# Patient Record
Sex: Female | Born: 1935 | Race: White | Hispanic: No | Smoking: Former smoker
Health system: Southern US, Community
[De-identification: ages and names within clinical notes are randomized; demographics above are authoritative.]

## PROBLEM LIST (undated history)

## (undated) DIAGNOSIS — L409 Psoriasis, unspecified: Secondary | ICD-10-CM

## (undated) DIAGNOSIS — C801 Malignant (primary) neoplasm, unspecified: Secondary | ICD-10-CM

## (undated) DIAGNOSIS — H269 Unspecified cataract: Secondary | ICD-10-CM

## (undated) DIAGNOSIS — G40909 Epilepsy, unspecified, not intractable, without status epilepticus: Secondary | ICD-10-CM

## (undated) DIAGNOSIS — Z598 Other problems related to housing and economic circumstances: Secondary | ICD-10-CM

## (undated) DIAGNOSIS — Z972 Presence of dental prosthetic device (complete) (partial): Secondary | ICD-10-CM

## (undated) DIAGNOSIS — I2510 Atherosclerotic heart disease of native coronary artery without angina pectoris: Secondary | ICD-10-CM

## (undated) DIAGNOSIS — IMO0001 Reserved for inherently not codable concepts without codable children: Secondary | ICD-10-CM

## (undated) DIAGNOSIS — I639 Cerebral infarction, unspecified: Secondary | ICD-10-CM

## (undated) DIAGNOSIS — I272 Pulmonary hypertension, unspecified: Secondary | ICD-10-CM

## (undated) DIAGNOSIS — H3530 Unspecified macular degeneration: Secondary | ICD-10-CM

## (undated) DIAGNOSIS — R569 Unspecified convulsions: Secondary | ICD-10-CM

## (undated) DIAGNOSIS — R413 Other amnesia: Secondary | ICD-10-CM

## (undated) DIAGNOSIS — J302 Other seasonal allergic rhinitis: Secondary | ICD-10-CM

## (undated) DIAGNOSIS — Z87898 Personal history of other specified conditions: Secondary | ICD-10-CM

## (undated) DIAGNOSIS — H548 Legal blindness, as defined in USA: Secondary | ICD-10-CM

## (undated) DIAGNOSIS — Z973 Presence of spectacles and contact lenses: Secondary | ICD-10-CM

## (undated) DIAGNOSIS — R443 Hallucinations, unspecified: Secondary | ICD-10-CM

## (undated) DIAGNOSIS — I10 Essential (primary) hypertension: Secondary | ICD-10-CM

## (undated) DIAGNOSIS — K08109 Complete loss of teeth, unspecified cause, unspecified class: Secondary | ICD-10-CM

## (undated) HISTORY — DX: Legal blindness, as defined in USA: H54.8

## (undated) HISTORY — PX: HIP SURGERY: SHX245

## (undated) HISTORY — PX: APPENDECTOMY: SHX54

## (undated) HISTORY — DX: Essential (primary) hypertension: I10

## (undated) HISTORY — DX: Malignant (primary) neoplasm, unspecified: C80.1

## (undated) HISTORY — DX: Hallucinations, unspecified: R44.3

## (undated) HISTORY — PX: SPINE SURGERY: SHX786

## (undated) HISTORY — DX: Cerebral infarction, unspecified: I63.9

## (undated) HISTORY — DX: Pulmonary hypertension, unspecified: I27.20

## (undated) HISTORY — DX: Epilepsy, unspecified, not intractable, without status epilepticus: G40.909

## (undated) HISTORY — DX: Atherosclerotic heart disease of native coronary artery without angina pectoris: I25.10

## (undated) HISTORY — DX: Unspecified convulsions: R56.9

## (undated) HISTORY — PX: GALLBLADDER SURGERY: SHX652

## (undated) HISTORY — DX: Unspecified cataract: H26.9

## (undated) HISTORY — DX: Psoriasis, unspecified: L40.9

## (undated) HISTORY — DX: Personal history of other specified conditions: Z87.898

## (undated) HISTORY — PX: CHOLECYSTECTOMY: SHX55

## (undated) HISTORY — PX: TONSILLECTOMY: SUR1361

## (undated) HISTORY — DX: Unspecified macular degeneration: H35.30

---

## 2002-05-07 ENCOUNTER — Ambulatory Visit (HOSPITAL_COMMUNITY): Admission: RE | Admit: 2002-05-07 | Discharge: 2002-05-07 | Payer: Self-pay | Admitting: Gastroenterology

## 2002-07-23 ENCOUNTER — Ambulatory Visit (HOSPITAL_COMMUNITY): Admission: RE | Admit: 2002-07-23 | Discharge: 2002-07-23 | Payer: Self-pay | Admitting: Gastroenterology

## 2003-07-24 ENCOUNTER — Ambulatory Visit (HOSPITAL_COMMUNITY): Admission: RE | Admit: 2003-07-24 | Discharge: 2003-07-24 | Payer: Self-pay | Admitting: Neurology

## 2003-07-24 ENCOUNTER — Encounter: Payer: Self-pay | Admitting: Neurology

## 2003-07-28 ENCOUNTER — Ambulatory Visit (HOSPITAL_COMMUNITY): Admission: RE | Admit: 2003-07-28 | Discharge: 2003-07-28 | Payer: Self-pay | Admitting: Neurology

## 2004-10-21 ENCOUNTER — Encounter: Admission: RE | Admit: 2004-10-21 | Discharge: 2004-10-21 | Payer: Self-pay | Admitting: Gastroenterology

## 2004-10-28 ENCOUNTER — Encounter (INDEPENDENT_AMBULATORY_CARE_PROVIDER_SITE_OTHER): Payer: Self-pay | Admitting: *Deleted

## 2004-10-28 ENCOUNTER — Ambulatory Visit (HOSPITAL_COMMUNITY): Admission: RE | Admit: 2004-10-28 | Discharge: 2004-10-28 | Payer: Self-pay | Admitting: Gastroenterology

## 2005-03-16 ENCOUNTER — Ambulatory Visit (HOSPITAL_COMMUNITY): Admission: RE | Admit: 2005-03-16 | Discharge: 2005-03-16 | Payer: Self-pay | Admitting: Gastroenterology

## 2005-11-20 DIAGNOSIS — I2510 Atherosclerotic heart disease of native coronary artery without angina pectoris: Secondary | ICD-10-CM

## 2005-11-20 HISTORY — DX: Atherosclerotic heart disease of native coronary artery without angina pectoris: I25.10

## 2005-11-22 ENCOUNTER — Inpatient Hospital Stay (HOSPITAL_COMMUNITY): Admission: EM | Admit: 2005-11-22 | Discharge: 2005-11-23 | Payer: Self-pay | Admitting: Emergency Medicine

## 2005-11-22 HISTORY — PX: CARDIAC CATHETERIZATION: SHX172

## 2008-10-20 ENCOUNTER — Encounter: Admission: RE | Admit: 2008-10-20 | Discharge: 2008-10-20 | Payer: Self-pay | Admitting: Gastroenterology

## 2009-05-21 ENCOUNTER — Encounter: Admission: RE | Admit: 2009-05-21 | Discharge: 2009-05-21 | Payer: Self-pay | Admitting: Gastroenterology

## 2010-07-19 ENCOUNTER — Encounter: Admission: RE | Admit: 2010-07-19 | Discharge: 2010-07-19 | Payer: Self-pay | Admitting: Internal Medicine

## 2011-04-07 NOTE — Discharge Summary (Signed)
[name], [name]               ACCOUNT NO.:  [id number]   MEDICAL RECORD NO.:  [id number]          PATIENT TYPE:  INP   LOCATION:  3734                         FACILITY:  [facility]   PHYSICIAN:  Darlin Priestly, MD  DATE OF BIRTH:  05/19/1925   DATE OF ADMISSION:  11/22/2005  DATE OF DISCHARGE:  11/23/2005                                 DISCHARGE SUMMARY   PRIMARY CARE PHYSICIAN:  Gabriel Earing, M.D.   DISCHARGE DIAGNOSES:  1.  Chest pain.  2.  Tachypalpitations.  3.  Hyperlipidemia.  4.  Hypertension.  5.  History of esophageal stricture with dilatation.  6.  Illegally blind.  7.  Right carotid bruit.  8.  Nonobstructive coronary disease.  9.  Hyperdynamic LV function.   DISCHARGE CONDITION:  Improved.   PROCEDURES:  On 11/22/05, heart catheterization by Dr. Julieanne Manson.   DISCHARGE MEDICATIONS:  1.  Enteric-coated aspirin 81 mg daily.  2.  Lopressor 50, half tablet b.i.d.  3.  Diltiazem ER 120 daily.  4.  Captopril 50 mg b.i.d.  5.  Lipitor 10 mg, which is a half of a 20 mg tablet, daily.  6.  Nasonex spray as needed as before.  7.  Okay to continue antihistamines.   DISCHARGE INSTRUCTIONS:  1.  Low-fat, low-salt diet.  2.  Wash cath site with soap and water.  Call if any bleeding, swelling, or      drainage.  3.  Follow up with Dr. Jenne Campus 12/07/2005 as previously instructed.   HISTORY OF PRESENT ILLNESS:  A 77 year old white female who was admitted to  [facility] by Dr. Earnest Conroy, on call for Dr. Jenne Campus, secondary  to chest pain.  She presented to the emergency room after experiencing an  episode of chest pain which began at home.  She was sedentary at the time.  It was described as substernal upper epigastric tightness.  It did not  radiate and was not associated with dyspnea, diaphoresis, or nausea and no  exacerbating factors.  She felt as though her heart rate was beating rapidly  at the time.  She presented to the emergency room but the  tightness was  pretty much resolved at that time but symptoms lasted three hours.   The patient previously has no history of cardiac disease.  She has seen Dr.  Jenne Campus in the past for chest tightness and rapid heart beat.  The patient  has cardiac risk factors of hypertension, family history of heart disease,  age, and smoking history but she has stopped.   Other history includes esophageal stricture which was dilated in 02/2005.   OUTPATIENT MEDICATIONS:  Essentially the same as discharge except Lopressor.   ALLERGIES:  Ephedra.   PAST SURGICAL HISTORY:  Splenectomy, cholecystectomy, cervical laminectomy.   FAMILY HISTORY/SOCIAL HISTORY/REVIEW OF SYSTEMS:  See history and physical.   PHYSICAL EXAMINATION AT DISCHARGE:  VITAL SIGNS:  Blood pressure 110/60,  pulse 80, respiratory rate 18, temperature 97.5, oxygen saturation on room  air 95%.  HEART:  Regular rate and rhythm, right groin cath site, moderate bruising,  no bruits.  LABORATORY DATA:  Admitting labs: Hemoglobin 13.5, hematocrit 39, WBC 8.5,  platelets 228.  Neutrophils 77, lymphs 8, monos 12, eosinophils 3, basophils  0.  At discharge, hemoglobin 14.6.  Protime 13.2, INR 1, PTT 30 on heparin  at 1.29.   Admission chemistries: Sodium 139, potassium 3.9, chloride 107, CO2 27,  glucose 120, BUN 18, creatinine 0.8, calcium 8.4, total protein 5.8, AST 25,  ALT 15, alkaline phosphatase 47, total bilirubin 1.1.  Magnesium 2.2.  Potassium at discharge 4.6, BUN 7, creatinine 0.7.   Cardiac enzymes were negative ranging 113, 111, and 82.5.  Troponin-I of  0.01.   UA clear except for trace leukocyte esterase.  Clean catch was done.   Chest x-ray on admission: No segmental region of consolidation or congestive  failure.  Mild increase in lung markings.  It also recommended PA and  lateral chest x-ray at some point and that was to be done as an outpatient  if Dr. Jenne Campus agrees.   HOSPITAL COURSE:  Ms. [name] was admitted  to [facility] by Dr.  Waldon Reining, on call for Dr. Jenne Campus, to start on intravenous heparin,  nitroglycerin, and underwent cardiac catheterization revealing  nonobstructive coronary disease.  RCA had a 20-30% stenosis.  LV was  hyperdynamic.  She had no renal artery stenosis, no abdominal aortic  aneurysms.  Please see the dictated note.   PLAN:  The patient was seen 11/23/2005 by Dr. Clarene Duke who also did a heart  cath and felt she was stable for discharge home and will follow up with Dr.  Jenne Campus.  Here in the hospital, she had no arrhythmias that are documented.  She only has sinus rhythm.      [name]. [name]      Darlin Priestly, MD  Electronically Signed    LRI/MEDQ  D:  11/23/2005  T:  11/23/2005  Job:  [id number]   cc:   Gabriel Earing, M.D.  Fax: [phone]

## 2011-04-07 NOTE — H&P (Signed)
NAMECRYSTINA, BORRAYO               ACCOUNT NO.:  [id number]   MEDICAL RECORD NO.:  [id number]          PATIENT TYPE:  EMS   LOCATION:  MAJO                         FACILITY:  [facility]   PHYSICIAN:  Ulyses Amor, MD DATE OF BIRTH:  [birth year]   DATE OF ADMISSION:  11/21/2005  DATE OF DISCHARGE:                                HISTORY & PHYSICAL   HISTORY OF PRESENT ILLNESS:  [name] is a 76 year old white woman who  is admitted to [facility] for further evaluation of chest pain.   The patient, who has no documented past history of cardiac disease,  presented to the emergency room after experiencing episode of chest pain  which began at home.  She was sedentary at the time.  The chest pain was  described as a lower substernal or upper epigastric tightness.  It did not  radiate.  It was not associated with dyspnea, diaphoresis, or nausea.  There  were no exacerbating or ameliorating factors.  It appeared not to be related  to position, activity, meals, or respirations.  She did note that her heart  seemed to be beating rapidly during this time.  She presented to the  emergency department for further evaluation.  Her tightness has largely  resolved at this time.  The total duration of symptoms was approximately 3  hours.   As noted, the patient has no documented history of cardiac disease,  including no history of coronary artery disease, congestive heart failure,  or arrhythmia.  She has seen Darlin Priestly, M.D. once in the past for  complaint of chest tightness and a rapid heart beat, similar to what she  experienced tonight, though not as long in duration.   The patient has a number of risk factors for coronary artery disease  including hypertension, family history (father), and a smoking history  (discontinued in the past).  There is no history of diabetes mellitus.  There is a history of dyslipidemia.   The patient's only significant medical problem is that  of an esophageal  stricture.  She was most recently dilated in April of this year.   MEDICATIONS:  1.  Captopril.  2.  Lipitor.  3.  Several other unknown medications.   ALLERGIES:  EFFEDRA.   PAST SURGICAL HISTORY:  Splenectomy, cholecystectomy, cervical laminectomy.   SOCIAL HISTORY:  The patient lives alone in assisted living facility.  She  is divorced.  She neither smokes cigarettes nor drinks alcohol at this time.   FAMILY HISTORY:  Her mother died of pulmonary embolism.  Her father died of  coronary artery disease.   REVIEW OF SYSTEMS:  No problems related to her head, eyes, ears, nose,  mouth, throat, lungs, gastrointestinal system, genitourinary system, or  extremities other than is described above.  There is no history of  neurologic or psychiatric disorder.  There is no history of fever, chills,  or weight loss.   PHYSICAL EXAMINATION:  VITAL SIGNS:  Blood pressure 129/52, pulse 103 and  regular, respirations 20, temperature 97.9, pulse oximetry 98% on room air.  GENERAL:  The patient  was an elderly white woman in no discomfort.  She was  alert, oriented, appropriate, and responsive.  HEENT:  Head, eyes, nose, and mouth are normal.  NECK:  Without thyromegaly or adenopathy.  Carotid pulses were palpable  bilaterally and without bruits.  CARDIOVASCULAR:  Normal S1 and S2.  There was no S3, S4, murmur, rub, or  click.  Cardiac rhythm is regular.  No chest wall tenderness was noted.  LUNGS:  Clear.  ABDOMEN:  Soft and nontender.  There was no mass, hepatosplenomegaly, bruit,  distention, rebound, guarding, or rigidity.  Bowel sounds were normal.  BREASTS:  PELVIC:  RECTAL:  Not performed as they were not pertinent to the  reason for acute care hospitalization.  EXTREMITIES:  Without edema, deviation, or deformity.  Radial and dorsalis  pedal pulses were palpable bilaterally.  NEUROLOGY:  Brief screening neurologic survey was unremarkable.   The electrocardiogram  revealed sinus tachycardia and a low voltage QRS.  The  tracing demonstrated no changes specific for ischemia or infarction.  The  chest radiograph, according to the radiologist, demonstrated no evidence of  acute disease.   LABORATORY DATA:  The initial set of cardiac markers revealed a myoglobin of  80.2, CK-MB 1.3, and troponin less than 0.05.  Urinalysis was unremarkable.  White count was 8.5 with a hemoglobin of 13.5 and hematocrit of 39.0.  Potassium is 3.9, BUN 8, and creatinine 0.8.  The remaining studies were  pending at the time of this dictation.   IMPRESSION:  1.  Chest pain, rule out coronary artery disease.  2.  Hypertension.  3.  Dyslipidemia.  4.  Esophageal stricture.   PLAN:  1.  Telemetry.  2.  Serial cardiac enzymes.  3.  Aspirin.  4.  Intravenous heparin.  5.  Intravenous nitroglycerin.  6.  Fasting lipid profile.  7.  Further measures per Dr. Jenne Campus.      Ulyses Amor, MD  Electronically Signed     MSC/MEDQ  D:  11/21/2005  T:  11/22/2005  Job:  [id number]   cc:   Ulyses Amor, MD  Fax: [phone]   Darlin Priestly, MD  Fax: [phone]

## 2011-04-07 NOTE — Consult Note (Signed)
[place]  Patient:    [name], [name] Visit Number: [id number] MRN: [id number]          Service Type: END Location: ENDO Attending Physician:  Charna Elizabeth Dictated by:   Anselmo Rod, M.D. Proc. Date: 05/07/02 Admit Date:  05/07/2002 Discharge Date: 05/07/2002   CC:         Earlene Plater L. Cloward, M.D.  [facility]   Consultation Report  DATE OF BIRTH:  1925/03/16.  REASON FOR CONSULTATION:  Acute dysphagia.  ASSESSMENT: 1. Acute dysphagia:  Rule out recurrent stricture. 2. History of esophageal stricture with repeat dilatation being done in the    past, the last one done in 1998. 3. History of gastroesophageal reflux disease on Tagamet. 4. History of hypertension on Captopril. 5. Hypercholesterolemia on Lipitor. 6. Status post splenectomy, question reason for splenectomy not clear.  RECOMMENDATIONS: 1. EGD today. 2. Further recommendations made after EGD has been done.  HISTORY OF PRESENT ILLNESS:  The patient is a pleasant 77 year old white female who has recently relocated to [place], [place] from [place] where she has received most of her GI care.  The patient tells me that she has had an esophageal stricture dilated in the past.  The last dilatation being in 1998.  The patient has a history of reflux for which she takes Tagamet on a regular basis, but of late, she has had a problem swallowing her pills.  She was in her usual state of health, was able to eat normally until earlier this morning she swallowed a couple of pills and tried to wash this down with a bite of cheese toast.  She feels the cheese toast stuck in her throat and later came back up when she attempted to cough.  The patient has a sense that her pills must still be stuck in her esophagus.  She is concerned about these symptoms.  This morning she could not even drink a few sips of water.  She denies any genitourinary or  cardiorespiratory complaints.  Appetite is good and weight has been stable.  REVIEW OF SYSTEMS:  There is no history of any anemia, blood transfusions, or dark stools.  She denies history of ulcers, jaundice, colitis, diarrhea, or constipation.  There is no history of hematochezia, or melena.  She denies any CNS or psychiatric complaints.  ALLERGIES:  She has no known drug allergies.  MEDICATIONS:  Captopril, Lipitor, aspirin 81 mg per day, and Tagamet for reflux.  PAST MEDICAL HISTORY:  See list above.  SOCIAL HISTORY:  She is divorced.  She has a son who lives in [place] and a daughter-in-law who lives in [place].  Her son is a local judge.  She lives in independent living housing in [place].  She used to smoke a pack per day for over 40 years but quit about 7 or 8 years ago.  She drinks an occasional beer and denies any history of drugs.  FAMILY HISTORY:  Her parents died of a MI in their 88s.  She has several siblings, all of whom are relatively healthy.  There is no known family history of breast, uterine, or colon cancer.  PHYSICAL EXAMINATION:  GENERAL:  Revealed an elderly cooperative white female in no acute distress.  VITAL SIGNS:  Vital signs are stable.  The patient is afebrile.  NECK:  Supple.  CHEST:  Clear to auscultation.  HEART:  S1 and S2 regular.  ABDOMEN:  Soft with a well-healed surgical scar in the midline above the umbilicus and a surgical scar in the midline below the umbilicus.  Nontender with normal bowel sounds.  No masses palpable.  LABORATORY DATA:  Not available.  ASSESSMENT:  Esophagogastroduodenoscopy is to be done.  Further recommendation is made after the esophagogastroduodenoscopy. Dictated by:   Anselmo Rod, M.D. Attending Physician:  Charna Elizabeth DD:  05/07/02 TD:  05/09/02 Job: [id number]

## 2011-04-07 NOTE — Procedures (Signed)
[place]  Patient:    [name], [name] Visit Number: [id number] MRN: [id number]          Service Type: END Location: ENDO Attending Physician:  Charna Elizabeth Dictated by:   Anselmo Rod, M.D. Proc. Date: 05/07/02 Admit Date:  05/07/2002 Discharge Date: 05/07/2002   CC:         Earlene Plater L. Cloward, M.D., [facility], [facility] [address]   Procedure Report  DATE OF BIRTH:  Aug 16, 1925.  PROCEDURE:  Esophagogastroduodenoscopy.  ENDOSCOPIST:  Anselmo Rod, M.D.  INSTRUMENT USED:  Olympus video panendoscope.  INDICATION FOR PROCEDURE:  Severe dysphagia developed acutely this morning in a 77 year old white female who has had multiple dilatations of the esophagus done in the past.  Rule out recurrent stricture.  Dilatation is planned if possible.  PREPROCEDURE PREPARATION:  Informed consent was procured from the patient. The patient was fasted for eight hours prior to the procedure.  PREPROCEDURE PHYSICAL:  VITAL SIGNS:  The patient had stable vital signs.  NECK:  Supple.  CHEST:  Clear to auscultation.  S1, S2 regular.  ABDOMEN:  Soft with normal bowel sounds.  DESCRIPTION OF PROCEDURE:  The patient was placed in the left lateral decubitus position and sedated with 30 mg of Demerol and 2.5 mg of Versed intravenously.  Once the patient was adequately sedate and maintained on low-flow oxygen and continuous cardiac monitoring, the Olympus video panendoscope was advanced through the mouthpiece, over the tongue, into the esophagus under direct vision.  There was slight hesitation in passing the scope through the upper esophageal sphincter, but the entire esophagus appeared normal.  There was diffuse gastritis throughout the gastric mucosa with hemorrhagic areas, with old heme overlying certain patches of gastric mucosa.  No frank ulcers, erosions, masses, or polyps were seen.  There was some old debris in the stomach, which might  indicate a component of gastroparesis.  The proximal small bowel appeared normal.  IMPRESSION: 1. Question upper esophageal sphincter stricture, seemed to have spontaneously    dilated with passage of the scope. 2. Diffuse hemorrhagic gastritis. 3. Normal proximal small bowel.  RECOMMENDATIONS: 1. A soft diet has been recommended for now. 2. A barium swallow with video fluoroscopy will be considered for further    evaluation. 3. Hold off on all aspirin products for now. 4. Avoid Tagamet, try Pepcid AC for reflux. 5. Outpatient follow-up within the next week. Dictated by:   Anselmo Rod, M.D. Attending Physician:  Charna Elizabeth DD:  05/07/02 TD:  05/09/02 Job: [id number]

## 2011-04-07 NOTE — Cardiovascular Report (Signed)
[name], [name]               ACCOUNT NO.:  [id number]   MEDICAL RECORD NO.:  [id number]          PATIENT TYPE:  INP   LOCATION:  3734                         FACILITY:  [facility]   PHYSICIAN:  Thereasa Solo. Little, M.D. DATE OF BIRTH:  1925/06/18   DATE OF PROCEDURE:  11/22/2005  DATE OF DISCHARGE:                              CARDIAC CATHETERIZATION   This 77 year old female was admitted to the hospital after a prolonged  episode of chest tightness. Because of this, she is brought to the cardiac  cath lab for cardiac catheterization.  Her EKG is normal, and her cardiac  enzymes were unremarkable.   She has a family history of heart disease and abdominal aortic aneurysms.  She also is hypertensive and smokes.   PROCEDURE:  After obtaining informed consent, the patient was prepped and  draped in the usual sterile fashion exposing the right groin.  Local  anesthetic with 1% Xylocaine via the Seldinger technique was employed and a  5-French introducer sheath was placed into the right femoral artery.  Left  and right coronary arteriography, ventriculography in the RAO projection,  and a distal aortogram was performed.   COMPLICATIONS:  None.   TOTAL CONTRAST:  85 cc.   DIAGNOSTIC EQUIPMENT:  5-French Judkins configuration catheters.   RESULTS:  1.  Hemodynamic monitoring:  Central aortic pressure was 142/75.  Left      ventricular pressure was 143/ 15, and there was no aortic valve gradient      noted at time of pullback.  2.  Ventriculography:  Ventriculography in the RAO projection using 20 cc of      contrast at 12 cc per second was associated with marked ectopy.  The LV      appeared to be markedly hyperdynamic and there was LVH.  The left      ventricular end-diastolic pressure was 28. (She had an echocardiogram      performed May 15, 2005 that showed normal LV systolic function and      diastolic dysfunction.  Her LV wall thickness was slightly increased).  3.  Distal  aortogram:  Distal aortogram using 30 cc of contrast at 20 cc per      second revealed no renal artery stenosis, no abdominal aortic aneurysm,      and no proximal iliac disease.  It does appear that her right kidney      sits lower in her abdomen than the left.  4.  Coronary arteriography:  On fluoroscopy, no calcification was seen.   1.  Left main:  Normal.  2.  Circumflex:  The circumflex had only minor irregularities.  It gave rise      to large OM #1 and a small OM #2, all of which were free of disease.  3.  LAD:  The LAD does not reach the apex of the heart.  The distal third of      the LAD is relatively small but there is no focal narrowing.  The first      diagonal, which is actually larger than the LAD, extends way down around  the lateral wall and actually wraps around the, what appears to be, the      lateral wall of the heart supplying some of the inferior lateral wall.  4.  Right coronary artery:  The right coronary artery had a 20-30% eccentric      area of narrowing in the proximal portion.  The PDA and posterior      lateral vessels were free of disease.   CONCLUSION:  1.  Hyperdynamic left ventricle.  2.  Elevated left ventricular end-diastolic pressure of 28.  3.  Questionable LVH  4.  Minimal coronary artery disease.  5.  No renal artery stenosis or abdominal aortic aneurysm.   I cannot explain her chest tightness based on her cardiac anatomy.  She  should be ready for discharge to home in the morning.  Because she is blind  and lives alone, I will keep her overnight.  I have discontinued her IV  heparin, IV nitroglycerin.           ______________________________  Thereasa Solo. Little, M.D.     ABL/MEDQ  D:  11/22/2005  T:  11/22/2005  Job:  [id number]   cc:   Darlin Priestly, MD  Fax: [phone]   Gabriel Earing, M.D.  Fax: [phone]

## 2011-04-07 NOTE — Op Note (Signed)
NAME:  [name], [name]                         ACCOUNT NO.:  [id number]   MEDICAL RECORD NO.:  [id number]                   PATIENT TYPE:  AMB   LOCATION:  ENDO                                 FACILITY:  [facility]   PHYSICIAN:  Charna Elizabeth, M.D.                   DATE OF BIRTH:  12-Jan-1925   DATE OF PROCEDURE:  07/23/2002  DATE OF DISCHARGE:                                 OPERATIVE REPORT   PROCEDURE:  Screening colonoscopy.   ENDOSCOPIST:  Charna Elizabeth, M.D.   INSTRUMENT USED:  Olympus video colonoscope (adjustable pediatric scope).   INDICATIONS:  A 77 year old white female undergoing screening colonoscopy.  Rule out colonic polyps, masses, hemorrhoids, etc.   PREPROCEDURE PREPARATION:  Informed consent was procured from the patient.  The patient was fasted for eight hours prior to the procedure and prepped  with a bottle of magnesium citrate and a gallon of NuLytely the night prior  to the procedure.   PREPROCEDURE PHYSICAL:  VITAL SIGNS:  The patient had stable vital signs.  NECK:  Supple.  CHEST:  Clear to auscultation.  S1, S2 regular.  ABDOMEN:  Soft with normal bowel sounds.  Well-healed surgical scars in the  midline from previous splenectomy in the right upper quadrant and previous  open cholecystectomy.   DESCRIPTION OF PROCEDURE:  The patient was placed in the left lateral  decubitus position and sedated with 70 mg of Demerol and 7 mg of Versed  intravenously.  Once the patient was adequately sedate and maintained on low-  flow oxygen and continuous cardiac monitoring, the Olympus video colonoscope  was advanced from the rectum to the cecum with slight difficulty.  The  patient had a very atonic colon.  There was scattered diverticular disease  with more prominent changes in the left colon.  No masses, polyps, erosions,  or ulcerations were seen.   IMPRESSION:  1. Scattered diverticulosis with more prominent changes in the left colon.  2. No masses or polyps  seen.   RECOMMENDATIONS:  1. Brochures on diverticular disease have been handed to the patient and her     family for their education.  Fifteen to 20 g of fiber in the diet have     been recommended with liberal fluid intake.  2.     Repeat colorectal screening is recommended in the next 10 years unless the     patient develops any abnormal symptoms in the interim.  3. Outpatient follow-up on a p.r.n. basis.                                                 Charna Elizabeth, M.D.    JM/MEDQ  D:  07/23/2002  T:  07/24/2002  Job:  [id number]   cc:  Davis L. Cloward, M.D.  [address]  [place]  [place] [address]  Fax: [phone]

## 2011-04-07 NOTE — Op Note (Signed)
[name], [name]               ACCOUNT NO.:  [id number]   MEDICAL RECORD NO.:  [id number]          PATIENT TYPE:  AMB   LOCATION:  ENDO                         FACILITY:  [facility]   PHYSICIAN:  Jordan Hawks. Elnoria Howard, MD    DATE OF BIRTH:  Mar 10, 1925   DATE OF PROCEDURE:  10/28/2004  DATE OF DISCHARGE:                                 OPERATIVE REPORT   PROCEDURES:  Esophagogastroduodenoscopy with dilatation.   INDICATIONS:  Dysphagia and abnormal esophagogram.   ENDOSCOPIST:  Jordan Hawks. Elnoria Howard, M.D.   EQUIPMENT USED:  An adult Olympus endoscope.   CONSENT:  Informed consent was obtained from the patient, describing the  risks of bleeding, infection, perforation, medication reaction and the risk  of death, all of which are not exclusive of any other complications that may  occur.   PHYSICAL EXAMINATION:  CARDIAC:  Regular rate and rhythm.  LUNGS:  Clear to auscultation bilaterally.  ABDOMEN:  Flat, soft, nontender and nondistended.   MEDICATIONS:  1.  Versed 4 mg IV.  2.  Demerol 40 mg IV.   DESCRIPTION OF PROCEDURE:  After adequate sedation was achieved, the patient  was noted to have localized erythema at the site of IV insertion.  It was  felt that the patient may have an allergic reaction to the Demerol.  Vital  signs were monitored closely and there was no evidence of hypotension or  decrease in oxygen saturation.  No evidence of audible wheezing.  As the  patient was noted to be hemodynamically stable, the procedure was initiated.  The endoscope was advanced from the oral cavity to the proximal duodenum  under direct visualization without difficulty.  The duodenum was noted to be  normal.  On withdraw of the endoscope into the stomach, the patient is  commented to have a deformed pyloris.  Multiple passes through the pyloris  was negative for any evidence of ulcer, however, the area was noted to be  friable.  Within the gastric body itself there is evidence of a gastritis  which may be representative of an atrial thick gastritis versus infectious  gastritis.  Retroflexion was negative for hiatal hernia or any masses in the  cardia region.  The endoscope was straightened and withdrawn into the  esophagus.  A stricture was noted to be in the distal esophagus  approximately 40-42 cm.  There was a stricture immediately proximal to that  area which was benign in appearance.  The lumen was patent.  The endoscope  was able to be passed with ease through this area.  There was no clear  evidence of esophagitis and the stricture was no malignant in appearance.  The endoscope was then inserted back into the stomach and a guide wire was  passed through the channel and the endoscope was removed over the guide wire  after visual placement of the guide wire in the distal stomach.  With guide  wire in place, redilatation was initiated starting with 10 mm and  progressing to 11 mm, 12 mm and 12.8 mm.  Throughout these initial  dilatations, there was no resistance that  was encountered with gentle  insertion of the Savary dilators.  The decision was made to increase the  size to 14 mm.  At this juncture, the patient was noted to have minimal heme  and this was inserted with ease.  A final dilatation was performed with a 15  mm dilator and a slightly greater amount of heme was noted as compared to  the 14 mm dilator.  Both the dilator and the guide wire were removed at this  time.  Again the 15 mm was inserted without difficulty.  Reevaluation of the  esophagus with the endoscope revealed that there was heme in the distal  esophagus corresponding to the site of the prior stricture.  The endoscope  was then withdrawn from the patient and the procedure was terminated.  The  patient's neck and supraclavicular area was palpated for any evidence of  crepitus, which was negative.  The patient tolerated the procedure well and  no immediate complications were encountered.   PLAN:  The  plan at this time is:  1.  To continue the PPI.  2.  Soft diet for the next 24 hours and advance as tolerated.  3.  Return to clinic in two weeks.       PDH/MEDQ  D:  10/28/2004  T:  10/29/2004  Job:  [id number]

## 2011-05-18 ENCOUNTER — Other Ambulatory Visit: Payer: Self-pay | Admitting: Neurology

## 2011-05-18 DIAGNOSIS — R413 Other amnesia: Secondary | ICD-10-CM

## 2011-05-18 DIAGNOSIS — R51 Headache: Secondary | ICD-10-CM

## 2011-05-18 DIAGNOSIS — H543 Unqualified visual loss, both eyes: Secondary | ICD-10-CM

## 2011-05-18 DIAGNOSIS — F22 Delusional disorders: Secondary | ICD-10-CM

## 2011-05-25 ENCOUNTER — Ambulatory Visit
Admission: RE | Admit: 2011-05-25 | Discharge: 2011-05-25 | Disposition: A | Discharge status: Home / Self Care | Payer: Medicare Other | Source: Ambulatory Visit | Attending: Neurology | Admitting: Neurology

## 2011-05-25 DIAGNOSIS — H543 Unqualified visual loss, both eyes: Secondary | ICD-10-CM

## 2011-05-25 DIAGNOSIS — R51 Headache: Secondary | ICD-10-CM

## 2011-05-25 DIAGNOSIS — R413 Other amnesia: Secondary | ICD-10-CM

## 2011-05-25 DIAGNOSIS — F22 Delusional disorders: Secondary | ICD-10-CM

## 2011-07-07 ENCOUNTER — Emergency Department (HOSPITAL_COMMUNITY): Payer: Medicare Other

## 2011-07-07 ENCOUNTER — Emergency Department (HOSPITAL_COMMUNITY)
Admission: EM | Admit: 2011-07-07 | Discharge: 2011-07-07 | Disposition: A | Discharge status: Home / Self Care | Payer: Medicare Other | Attending: Emergency Medicine | Admitting: Emergency Medicine

## 2011-07-07 DIAGNOSIS — B9789 Other viral agents as the cause of diseases classified elsewhere: Principal | ICD-10-CM | POA: Insufficient documentation

## 2011-07-07 DIAGNOSIS — R112 Nausea with vomiting, unspecified: Secondary | ICD-10-CM | POA: Insufficient documentation

## 2011-07-07 DIAGNOSIS — G3183 Dementia with Lewy bodies: Secondary | ICD-10-CM | POA: Insufficient documentation

## 2011-07-07 DIAGNOSIS — Z79899 Other long term (current) drug therapy: Secondary | ICD-10-CM | POA: Insufficient documentation

## 2011-07-07 DIAGNOSIS — F0280 Dementia in other diseases classified elsewhere without behavioral disturbance: Secondary | ICD-10-CM | POA: Insufficient documentation

## 2011-07-07 DIAGNOSIS — R197 Diarrhea, unspecified: Secondary | ICD-10-CM | POA: Insufficient documentation

## 2011-07-07 DIAGNOSIS — I10 Essential (primary) hypertension: Secondary | ICD-10-CM | POA: Insufficient documentation

## 2011-07-07 DIAGNOSIS — F0390 Unspecified dementia without behavioral disturbance: Secondary | ICD-10-CM | POA: Insufficient documentation

## 2011-07-07 LAB — DIFFERENTIAL
Eosinophils Absolute: 0 10*3/uL (ref 0.0–0.7)
Eosinophils Relative: 0 % (ref 0–5)
Lymphocytes Relative: 2 % — ABNORMAL LOW (ref 12–46)
Lymphs Abs: 0.3 10*3/uL — ABNORMAL LOW (ref 0.7–4.0)
Monocytes Absolute: 0.4 10*3/uL (ref 0.1–1.0)
Monocytes Relative: 3 % (ref 3–12)
Neutro Abs: 13.5 10*3/uL — ABNORMAL HIGH (ref 1.7–7.7)
Neutrophils Relative %: 95 % — ABNORMAL HIGH (ref 43–77)

## 2011-07-07 LAB — COMPREHENSIVE METABOLIC PANEL
ALT: 17 U/L (ref 0–35)
AST: 16 U/L (ref 0–37)
Albumin: 3.1 g/dL — ABNORMAL LOW (ref 3.5–5.2)
Alkaline Phosphatase: 63 U/L (ref 39–117)
BUN: 8 mg/dL (ref 6–23)
CO2: 31 mEq/L (ref 19–32)
Calcium: 8.3 mg/dL — ABNORMAL LOW (ref 8.4–10.5)
Chloride: 94 mEq/L — ABNORMAL LOW (ref 96–112)
Creatinine, Ser: <0.47 mg/dL — ABNORMAL LOW (ref 0.50–1.10)
Potassium: 3.4 mEq/L — ABNORMAL LOW (ref 3.5–5.1)
Sodium: 130 mEq/L — ABNORMAL LOW (ref 135–145)
Total Protein: 6.3 g/dL (ref 6.0–8.3)

## 2011-07-07 LAB — URINALYSIS, ROUTINE W REFLEX MICROSCOPIC
Bilirubin Urine: NEGATIVE
Glucose, UA: NEGATIVE mg/dL
Ketones, ur: NEGATIVE mg/dL
Leukocytes, UA: NEGATIVE
Nitrite: NEGATIVE
Specific Gravity, Urine: 1.009 (ref 1.005–1.030)
Urobilinogen, UA: 0.2 mg/dL (ref 0.0–1.0)
pH: 8 (ref 5.0–8.0)

## 2011-07-07 LAB — URINE MICROSCOPIC-ADD ON

## 2011-07-07 LAB — CBC
HCT: 41.9 % (ref 36.0–46.0)
Hemoglobin: 14.4 g/dL (ref 12.0–15.0)
MCH: 30.8 pg (ref 26.0–34.0)
MCHC: 34.4 g/dL (ref 30.0–36.0)
MCV: 89.7 fL (ref 78.0–100.0)
Platelets: 558 10*3/uL — ABNORMAL HIGH (ref 150–400)
RBC: 4.67 MIL/uL (ref 3.87–5.11)
RDW: 13.9 % (ref 11.5–15.5)
WBC: 14.2 10*3/uL — ABNORMAL HIGH (ref 4.0–10.5)

## 2011-07-07 LAB — POCT I-STAT TROPONIN I: Troponin i, poc: 0 ng/mL (ref 0.00–0.08)

## 2011-07-07 LAB — PROTIME-INR: INR: 1.03 (ref 0.00–1.49)

## 2011-07-07 LAB — APTT: aPTT: 27 seconds (ref 24–37)

## 2011-07-08 LAB — URINE CULTURE
Colony Count: 8000
Culture  Setup Time: 201208171517

## 2012-06-21 ENCOUNTER — Other Ambulatory Visit: Payer: Self-pay | Admitting: Family Medicine

## 2012-06-21 DIAGNOSIS — N6310 Unspecified lump in the right breast, unspecified quadrant: Secondary | ICD-10-CM

## 2012-06-27 ENCOUNTER — Ambulatory Visit
Admission: RE | Admit: 2012-06-27 | Discharge: 2012-06-27 | Disposition: A | Discharge status: Home / Self Care | Payer: Medicare Other | Source: Ambulatory Visit | Attending: Family Medicine | Admitting: Family Medicine

## 2012-06-27 ENCOUNTER — Other Ambulatory Visit: Payer: Self-pay | Admitting: Family Medicine

## 2012-06-27 DIAGNOSIS — N6310 Unspecified lump in the right breast, unspecified quadrant: Secondary | ICD-10-CM

## 2012-07-05 ENCOUNTER — Encounter (INDEPENDENT_AMBULATORY_CARE_PROVIDER_SITE_OTHER): Payer: Self-pay | Admitting: General Surgery

## 2012-07-05 ENCOUNTER — Ambulatory Visit (INDEPENDENT_AMBULATORY_CARE_PROVIDER_SITE_OTHER): Payer: Medicare Other | Admitting: General Surgery

## 2012-07-05 ENCOUNTER — Telehealth (INDEPENDENT_AMBULATORY_CARE_PROVIDER_SITE_OTHER): Payer: Self-pay

## 2012-07-05 VITALS — BP 150/88 | HR 72 | Temp 96.8°F | Resp 14 | Ht 63.0 in | Wt 89.2 lb

## 2012-07-05 DIAGNOSIS — C50419 Malignant neoplasm of upper-outer quadrant of unspecified female breast: Secondary | ICD-10-CM | POA: Insufficient documentation

## 2012-07-05 NOTE — Telephone Encounter (Signed)
Called Tami to request an appt for pt to have on a Friday b/c the son needs to come with pt and Friday's are the only day. Lieutenant Diego is going to ask Dr Darnelle Catalan on Monday if he will see this pt on Friday and she will get back with the pt's son [name]. I gave the son the info along with Tami's name. The pt understands.

## 2012-07-05 NOTE — Progress Notes (Signed)
Patient ID: [name], female   DOB: 06-Apr-1925, 78 y.o.   MRN: [id number]  Chief Complaint  Patient presents with  . Breast Cancer    HPI [name] is a 78 y.o. female.  Referred by Dr Vincenza Hews HPI This is an 78 year old female who is otherwise very healthy who presents after feeling a right breast mass. She lives in an assisted living facility. She is legally blind and does not drive. She does require some help as her memory is not the best either. When I asked her where she lives and some other simple questions she was not able to tell me the answers.she was evaluated at the breast center where she was noted on exam to have 2 separate masses. She had a mammogram which showed a high density mass in the area of palpable concern as well. Ultrasound shows in the 9:00 position 4 cm of the nipple a 1.4 x 0.8 x 2.1 cm lesion. There is another lesion in the 9:00 position 2 cm from nac measuring 1.8 x 0.7 x 0.9 cm. There is a lymph node with normal morphology noted in the right axilla. She underwent biopsy of both of these lesions. The biopsy of the first lesion shows detached fragments of mammary carcinoma. There cannot be a definitive decision between in situand  invasive tumor on this biopsy. The other biopsy shows definitive features of ductal carcinoma in situ that is suspicious for invasive malignancy. These are both the ER and PR positive at 100% and 95-100%. She comes in today to discuss her options. Past Medical History  Diagnosis Date  . Cancer     breast  . Psoriasis   . Legally blind     wear glasses, but needs to walk beside person to know where to go.  . Hypertension     Past Surgical History  Procedure Date  . Cholecystectomy   . Spine surgery     History reviewed. No pertinent family history.  Social History History  Substance Use Topics  . Smoking status: Never Smoker   . Smokeless tobacco: Never Used  . Alcohol Use: No    No Known Allergies  Current  Outpatient Prescriptions  Medication Sig Dispense Refill  . DERMA-SMOOTHE/FS BODY 0.01 % external oil as needed.      . metoprolol (LOPRESSOR) 50 MG tablet Daily.      Marland Kitchen triamcinolone ointment (KENALOG) 0.5 % as needed.        Review of Systems Review of Systems  Constitutional: Negative for fever, chills and unexpected weight change.  HENT: Negative for hearing loss, congestion, sore throat, trouble swallowing and voice change.   Eyes: Negative for visual disturbance.  Respiratory: Negative for cough and wheezing.   Cardiovascular: Positive for leg swelling. Negative for chest pain and palpitations.  Gastrointestinal: Negative for nausea, vomiting, abdominal pain, diarrhea, constipation, blood in stool, abdominal distention and anal bleeding.  Genitourinary: Negative for hematuria, vaginal bleeding and difficulty urinating.  Musculoskeletal: Negative for arthralgias.  Skin: Negative for rash and wound.  Neurological: Negative for seizures, syncope and headaches.  Hematological: Negative for adenopathy. Does not bruise/bleed easily.  Psychiatric/Behavioral: Negative for confusion.    Blood pressure 150/88, pulse 72, temperature 96.8 F (36 C), temperature source Temporal, resp. rate 14, height 5\' 3"  (1.6 m), weight 89 lb 4 oz (40.484 kg).  Physical Exam Physical Exam  Vitals reviewed. Constitutional: She appears well-developed and well-nourished.  Neck: Neck supple.  Cardiovascular: Normal rate, regular rhythm and  normal heart sounds.   Pulmonary/Chest: Effort normal and breath sounds normal. She has no wheezes. She has no rales.    Abdominal: Soft.    Lymphadenopathy:    She has no cervical adenopathy.    Data Reviewed DIGITAL DIAGNOSTIC BILATERAL MAMMOGRAM WITH CAD AND RIGHT BREAST  ULTRASOUND:  Comparison: None  Findings: There is a scattered fibroglandular pattern. No mass or  suspicious calcifications are seen in the left breast. Spot  tangential view in the  upper-outer quadrant of the right breast,  posteriorly confirm the presence of a round high-density mass with  circumscribed margins in the area of palpable concern.  Mammographic images were processed with CAD.  On physical exam, I palpate a firm, fixed mass in the 9 o'clock  position, 4 cm the nipple which is approximately 3.5 cm by  palpation. A firm, mobile mass is palpated in the 9 o'clock  position, 2 cm the nipple approximately 2 cm by palpation. A firm,  mobile mass is palpated in the right axilla less than 1 cm by  palpation.  Ultrasound is performed, showing an oval, heterogeneously  hypoechoic mass with circumscribed margins in the 9 o'clock  position, 4 cm the nipple measuring 1.4 x 0.8 x 2.1 cm  corresponding to the area of clinical concern. An oval, hypoechoic  mass with ill-defined margins is imaged in the 9 o'clock position,  2 cm the nipple measuring 1.8 x 0.7 x 0.9 cm. A lymph node with  normal morphology is imaged in the right axilla.  IMPRESSION:  Right breast masses which a biopsy is done and reported separately.  No mammographic evidence of malignancy, left breast.    Assessment Right breast cancer    Plan    I think would be reasonable given her cognitive issues to consider antiestrogen therapy as primary treatment for her cancer.  We discussed this at length today with her and her son. We discussed this is not curative but would manage this cancer in an 61 yof.  She and her son are very much would like to discuss this without surgery and I think would be ok. We discussed the staging and pathophysiology of breast cancer. We discussed all of the different options for treatment for breast cancer including surgery, chemotherapy, radiation therapy, Herceptin, and antiestrogen therapy.  I think if we do surgery and this may be needed depending on course it would be simple mastectomy and we discussed postop course/risks of this. We discussed the risks of operation  including bleeding, infection, possible reoperation. She understands her further therapy will be based on what her stages at the time of her operation.         [name] 07/05/2012, 11:12 AM

## 2012-07-08 ENCOUNTER — Other Ambulatory Visit: Payer: Self-pay | Admitting: Oncology

## 2012-07-08 ENCOUNTER — Other Ambulatory Visit: Payer: Self-pay | Admitting: *Deleted

## 2012-07-08 ENCOUNTER — Telehealth: Payer: Self-pay | Admitting: Oncology

## 2012-07-08 DIAGNOSIS — C50419 Malignant neoplasm of upper-outer quadrant of unspecified female breast: Secondary | ICD-10-CM

## 2012-07-08 NOTE — Telephone Encounter (Signed)
I called Andrey Campanile- trying to reach Gerlene Burdock- her son.   Number was disconnected.   I relayed the only option on a Friday per his request through Dr. Doreen Salvage nurse Elease Hashimoto- August 30 at 10:30 for lab and 11:00 AM.   Andrey Campanile is the manager of the apt site that the patient resides.   Andrey Campanile will contact Richard and call me back with a confirmation or request to change.   Information entered into the system.

## 2012-07-09 ENCOUNTER — Telehealth: Payer: Self-pay | Admitting: Oncology

## 2012-07-09 NOTE — Telephone Encounter (Signed)
The son left a message that I returned to confirm her appt.   I will email her son at [email or web address] with the new patient letter and [facility] pamphlet.   The son verbalized understanding of the plan and has my number should he have questions.

## 2012-07-19 ENCOUNTER — Ambulatory Visit: Payer: Medicare Other

## 2012-07-19 ENCOUNTER — Telehealth: Payer: Self-pay | Admitting: *Deleted

## 2012-07-19 ENCOUNTER — Ambulatory Visit (HOSPITAL_BASED_OUTPATIENT_CLINIC_OR_DEPARTMENT_OTHER): Payer: Medicare Other | Admitting: Oncology

## 2012-07-19 ENCOUNTER — Other Ambulatory Visit (HOSPITAL_BASED_OUTPATIENT_CLINIC_OR_DEPARTMENT_OTHER): Payer: Medicare Other | Admitting: Lab

## 2012-07-19 VITALS — BP 161/74 | HR 84 | Temp 97.6°F | Resp 20 | Ht 60.0 in | Wt 89.4 lb

## 2012-07-19 DIAGNOSIS — C773 Secondary and unspecified malignant neoplasm of axilla and upper limb lymph nodes: Secondary | ICD-10-CM

## 2012-07-19 DIAGNOSIS — C50419 Malignant neoplasm of upper-outer quadrant of unspecified female breast: Secondary | ICD-10-CM

## 2012-07-19 DIAGNOSIS — C50919 Malignant neoplasm of unspecified site of unspecified female breast: Secondary | ICD-10-CM

## 2012-07-19 DIAGNOSIS — Z170 Estrogen receptor positive status [ER+]: Secondary | ICD-10-CM

## 2012-07-19 LAB — CBC WITH DIFFERENTIAL/PLATELET
BASO%: 0.9 % (ref 0.0–2.0)
Basophils Absolute: 0.1 10*3/uL (ref 0.0–0.1)
EOS%: 2.1 % (ref 0.0–7.0)
HCT: 41.6 % (ref 34.8–46.6)
LYMPH%: 9.5 % — ABNORMAL LOW (ref 14.0–49.7)
MCH: 31 pg (ref 25.1–34.0)
MCHC: 33.7 g/dL (ref 31.5–36.0)
MCV: 92 fL (ref 79.5–101.0)
NEUT%: 72 % (ref 38.4–76.8)
Platelets: 282 10*3/uL (ref 145–400)

## 2012-07-19 LAB — COMPREHENSIVE METABOLIC PANEL (CC13)
ALT: 13 U/L (ref 0–55)
AST: 24 U/L (ref 5–34)
BUN: 12 mg/dL (ref 7.0–26.0)
Creatinine: 0.8 mg/dL (ref 0.6–1.1)
Total Bilirubin: 1.3 mg/dL — ABNORMAL HIGH (ref 0.20–1.20)

## 2012-07-19 MED ORDER — ANASTROZOLE 1 MG PO TABS: 1.0000 mg | ORAL_TABLET | Freq: Every day | ORAL | Status: AC

## 2012-07-19 NOTE — Telephone Encounter (Signed)
Gave patient 10-04-2012 at 10:30am printed out calendar and gave to the patient per orders labs are not needed

## 2012-07-22 ENCOUNTER — Other Ambulatory Visit: Payer: Self-pay | Admitting: Oncology

## 2012-07-22 ENCOUNTER — Encounter: Payer: Self-pay | Admitting: Oncology

## 2012-07-22 NOTE — Progress Notes (Signed)
ID: Francee Piccolo   DOB: Nov 23, 1924  MR#: [id number]  WUJ#:[id number]  PCP: Cain Saupe, MD GYN:  SU:  OTHER MD:   HISTORY OF PRESENT ILLNESS: Ms. Isaacson palpated a mass in her right breast and brought this to her primary physician's attention. She was set up for bilateral diagnostic mammography with right ultrasonography at the breast Center 06/27/2012. There was a posterior high-density mass in the upper outer quadrant of the right breast which was palpable, mobile, and accompanied by a second mass in the right breast and an axillary mass as well. Ultrasound confirmed an oval heterogeneously hypoechoic mass with circumscribed margins in the 9:00 position of the breast measuring 2.1 cm. A second hypoechoic mass was noted closer to the nipple, measuring 1.8 cm. An enlarged lymph node with normal morphology was noted in the axilla.  Biopsy of the 2 breast masses was obtained the same day, and showed ([id number]) ductal carcinoma in situ, with foci suspicious for invasive carcinoma. There were also detached fragments of tissue. Definitive distinction between in situ and invasive tumor could not be made with this material. Tumors were strongly estrogen and progesterone receptor positive (95-100%). The patient's subsequent history is as detailed below.  INTERVAL HISTORY: The patient was seen at the breast clinic 07/19/2012 accompanied by [name] and friend Daine Gip   REVIEW OF SYSTEMS: The patient is essentially blind, and cannot read. She has no unusual or persistent pain, no headaches, no nausea or vomiting, no dizziness, there had been no recent falls, she denies any cough, phlegm production, pleurisy, shortness of breath, or change in bowel or bladder habits. She does have some psoriasis but no other rash. There has been no bleeding or fever. A detailed review of systems was entirely noncontributory.   PAST MEDICAL HISTORY: Past Medical History  Diagnosis Date  . Cancer     breast    . Psoriasis   . Legally blind     macular degeneration  . Hypertension     PAST SURGICAL HISTORY: Past Surgical History  Procedure Date  . Cholecystectomy   . Spine surgery   . Appendectomy   . Tonsillectomy     FAMILY HISTORY No family history on file. The patient's father died in his 2s from a myocardial infarction. Her mother also died in her 20s from the same cause. The patient had 2 brothers and 6 sisters. There is no history of breast or ovarian cancer in the immediate family.   GYNECOLOGIC HISTORY: The patient is GX P1. First live birth age 44. She does not recall when she went through the change of life. She never took hormone replacement.   SOCIAL HISTORY: Ms. 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 (She went by [name]). She is divorced, and lives in a retirement community, independent living section. Her son, [name], is a Industrial/product designer judge in [place]. The patient has 2 grandchildren, no greatgrandchildren. She is a International aid/development worker.   ADVANCED DIRECTIVES: The patient's son [name] is her healthcare power of attorney.  HEALTH MAINTENANCE: History  Substance Use Topics  . Smoking status: Never Smoker   . Smokeless tobacco: Never Used  . Alcohol Use: No     Colonoscopy:  PAP:  Bone density:  Lipid panel:  No Known Allergies  Current Outpatient Prescriptions  Medication Sig Dispense Refill  . DERMA-SMOOTHE/FS BODY 0.01 % external oil as needed.      . metoprolol (LOPRESSOR) 50 MG  tablet Daily.      Marland Kitchen triamcinolone ointment (KENALOG) 0.5 % as needed.      Marland Kitchen anastrozole (ARIMIDEX) 1 MG tablet Take 1 tablet (1 mg total) by mouth daily.  30 tablet  12    OBJECTIVE: Elderly white woman who appears well  Filed Vitals:   07/19/12 1042  BP: 161/74  Pulse: 84  Temp: 97.6 F (36.4 C)  Resp: 20     Body mass index is 17.46 kg/(m^2).    ECOG FS: 1 Sclerae unicteric Oropharynx clear No cervical or supraclavicular  adenopathy Lungs no rales or rhonchi Heart regular rate and rhythm Abd benign MSK no focal spinal tenderness, no peripheral edema Neuro: nonfocal Breasts: The right breast is lumpy, and palpating posterolaterally it is easy to feel a 2 cm mass, which is hard and movable, not involving skin or nipple. There is a small palpable axillary lymph node. I do not palpate a well-defined second mass closer to the nipple. Instead the whole breast is rather lumpy. The left breast is unremarkable.  LAB RESULTS: Lab Results  Component Value Date   WBC 6.3 07/19/2012   NEUTROABS 4.6 07/19/2012   HGB 14.0 07/19/2012   HCT 41.6 07/19/2012   MCV 92.0 07/19/2012   PLT 282 07/19/2012      Chemistry      Component Value Date/Time   NA 139 07/19/2012 1008   NA 130* 07/07/2011 1353   K 4.1 07/19/2012 1008   K 3.4* 07/07/2011 1353   CL 103 07/19/2012 1008   CL 94* 07/07/2011 1353   CO2 29 07/19/2012 1008   CO2 31 07/07/2011 1353   BUN 12.0 07/19/2012 1008   BUN 8 07/07/2011 1353   CREATININE 0.8 07/19/2012 1008   CREATININE <0.47* 07/07/2011 1353      Component Value Date/Time   CALCIUM 9.5 07/19/2012 1008   CALCIUM 8.3* 07/07/2011 1353   ALKPHOS 50 07/19/2012 1008   ALKPHOS 63 07/07/2011 1353   AST 24 07/19/2012 1008   AST 16 07/07/2011 1353   ALT 13 07/19/2012 1008   ALT 17 07/07/2011 1353   BILITOT 1.30* 07/19/2012 1008   BILITOT 0.4 07/07/2011 1353       Lab Results  Component Value Date   LABCA2 20 07/19/2012    No components found with this basename: [id number]    No results found for this basename: INR:1;PROTIME:1 in the last 168 hours  Urinalysis    Component Value Date/Time   COLORURINE YELLOW 07/07/2011 1345   APPEARANCEUR CLEAR 07/07/2011 1345   LABSPEC 1.009 07/07/2011 1345   PHURINE 8.0 07/07/2011 1345   GLUCOSEU NEGATIVE 07/07/2011 1345   HGBUR TRACE* 07/07/2011 1345   BILIRUBINUR NEGATIVE 07/07/2011 1345   KETONESUR NEGATIVE 07/07/2011 1345   PROTEINUR NEGATIVE 07/07/2011 1345   UROBILINOGEN  0.2 07/07/2011 1345   NITRITE NEGATIVE 07/07/2011 1345   LEUKOCYTESUR NEGATIVE 07/07/2011 1345    STUDIES: US Breast Right  06/27/2012  *RADIOLOGY REPORT*  Clinical Data:  Right breast mass  DIGITAL DIAGNOSTIC BILATERAL MAMMOGRAM WITH CAD AND RIGHT BREAST ULTRASOUND:  Comparison:  None  Findings:  There is a scattered fibroglandular pattern.  No mass or suspicious calcifications are seen in the left breast.  Spot tangential view in the upper-outer quadrant of the right breast, posteriorly confirm the presence of a round high-density mass with circumscribed margins in the area of palpable concern. Mammographic images were processed with CAD.  On physical exam, I palpate a firm, fixed mass in the 9  o'clock position, 4 cm the nipple which is approximately 3.5 cm by palpation.  A firm, mobile mass is palpated in the 9 o'clock position, 2 cm the nipple approximately 2 cm by palpation.  A firm, mobile mass is palpated in the right axilla less than 1 cm by palpation.  Ultrasound is performed, showing an oval, heterogeneously hypoechoic mass with circumscribed margins in the 9 o'clock position, 4 cm the nipple measuring 1.4 x 0.8 x 2.1 cm corresponding to the area of clinical concern. An oval, hypoechoic mass with ill-defined margins is imaged in the 9 o'clock position, 2 cm the nipple measuring 1.8 x 0.7 x 0.9 cm.  A lymph node with normal morphology is imaged in the right axilla.  IMPRESSION: Right breast masses which a biopsy is done and reported separately. No mammographic evidence of malignancy, left breast.  RECOMMENDATION: Ultrasound guided right breast biopsies.  BI-RADS CATEGORY 4:  Suspicious abnormality - biopsy should be considered.  Original Report Authenticated By: Hiram Gash, M.D.   [place] Core Biopsy  07/01/2012  **ADDENDUM** CREATED: 07/01/2012 13:08:17  Histologic evaluation demonstrates detached fragments of mammary carcinoma in site #1.  It is not possible to distinguish between invasive and in  situ carcinoma at this site.  Histologic evaluation demonstrates ductal carcinoma in situ with foci suspicious for invasion at site #2.  These findings are concordant with the imaging findings.  Results were discussed with the patient by telephone at her request.  She reports no complications from the procedure.  The patient asked that I discuss the findings with her friend, Daine Gip, by telephone.  Surgical consultation has been scheduled with Dr. Harden Mo for 07/05/2012 at 10:00 a.m.  Ms. Freida Busman was informed of the results of biopsies and of the time of the appointment with Dr. Dwain Sarna.  Ms. Freida Busman stated that she would be able to take Ms. Sedita to see Dr. Dwain Sarna on 07/05/2012.  **END ADDENDUM** SIGNED BY: Cain Saupe, M.D.   06/27/2012  *RADIOLOGY REPORT*  Clinical Data:  Right breast masses  ULTRASOUND GUIDED CORE BIOPSIES OF THE RIGHT BREAST  Comparison: Previous exams.  I met with the patient and we discussed the procedure of ultrasound- guided biopsy, including benefits and alternatives.  We discussed the high likelihood of a successful procedure. We discussed the risks of the procedure, including infection, bleeding, tissue injury, clip migration, and inadequate sampling.  Informed written consent was given.  Using sterile technique 2% lidocaine, ultrasound guidance and a 14 gauge automated biopsy device, biopsies were done of the right breast masses in the 9 o'clock position, 4 cm from the nipple (SITE 1) and 2 cm from the nipple (SITE 2) using an inferior approach. Site 1 is marked with a coil type clip and site 2 is marked with a ribbon type clip.  IMPRESSION: Ultrasound guided biopsy, right breast masses.  No apparent complications. Original Report Authenticated By: Daryl Eastern, M.D.   [place] Core Biopsy  07/01/2012  **ADDENDUM** CREATED: 07/01/2012 13:08:17  Histologic evaluation demonstrates detached fragments of mammary carcinoma in site #1.  It is not possible to distinguish  between invasive and in situ carcinoma at this site.  Histologic evaluation demonstrates ductal carcinoma in situ with foci suspicious for invasion at site #2.  These findings are concordant with the imaging findings.  Results were discussed with the patient by telephone at her request.  She reports no complications from the procedure.  The patient asked that I discuss the findings with her friend,  Daine Gip, by telephone.  Surgical consultation has been scheduled with Dr. Harden Mo for 07/05/2012 at 10:00 a.m.  Ms. Freida Busman was informed of the results of biopsies and of the time of the appointment with Dr. Dwain Sarna.  Ms. Freida Busman stated that she would be able to take Ms. Gunnerson to see Dr. Dwain Sarna on 07/05/2012.  **END ADDENDUM** SIGNED BY: Cain Saupe, M.D.   06/27/2012  *RADIOLOGY REPORT*  Clinical Data:  Right breast masses  ULTRASOUND GUIDED CORE BIOPSIES OF THE RIGHT BREAST  Comparison: Previous exams.  I met with the patient and we discussed the procedure of ultrasound- guided biopsy, including benefits and alternatives.  We discussed the high likelihood of a successful procedure. We discussed the risks of the procedure, including infection, bleeding, tissue injury, clip migration, and inadequate sampling.  Informed written consent was given.  Using sterile technique 2% lidocaine, ultrasound guidance and a 14 gauge automated biopsy device, biopsies were done of the right breast masses in the 9 o'clock position, 4 cm from the nipple (SITE 1) and 2 cm from the nipple (SITE 2) using an inferior approach. Site 1 is marked with a coil type clip and site 2 is marked with a ribbon type clip.  IMPRESSION: Ultrasound guided biopsy, right breast masses.  No apparent complications. Original Report Authenticated By: Daryl Eastern, M.D.   Mm Digital Diagnostic Bilat  06/27/2012  *RADIOLOGY REPORT*  Clinical Data:  Right breast mass  DIGITAL DIAGNOSTIC BILATERAL MAMMOGRAM WITH CAD AND RIGHT BREAST  ULTRASOUND:  Comparison:  None  Findings:  There is a scattered fibroglandular pattern.  No mass or suspicious calcifications are seen in the left breast.  Spot tangential view in the upper-outer quadrant of the right breast, posteriorly confirm the presence of a round high-density mass with circumscribed margins in the area of palpable concern. Mammographic images were processed with CAD.  On physical exam, I palpate a firm, fixed mass in the 9 o'clock position, 4 cm the nipple which is approximately 3.5 cm by palpation.  A firm, mobile mass is palpated in the 9 o'clock position, 2 cm the nipple approximately 2 cm by palpation.  A firm, mobile mass is palpated in the right axilla less than 1 cm by palpation.  Ultrasound is performed, showing an oval, heterogeneously hypoechoic mass with circumscribed margins in the 9 o'clock position, 4 cm the nipple measuring 1.4 x 0.8 x 2.1 cm corresponding to the area of clinical concern. An oval, hypoechoic mass with ill-defined margins is imaged in the 9 o'clock position, 2 cm the nipple measuring 1.8 x 0.7 x 0.9 cm.  A lymph node with normal morphology is imaged in the right axilla.  IMPRESSION: Right breast masses which a biopsy is done and reported separately. No mammographic evidence of malignancy, left breast.  RECOMMENDATION: Ultrasound guided right breast biopsies.  BI-RADS CATEGORY 4:  Suspicious abnormality - biopsy should be considered.  Original Report Authenticated By: Hiram Gash, M.D.    ASSESSMENT: 78 y.o. [place] woman status post Right breast biopsy 06/27/2012 for a ductal carcinoma in situ, with possible microinvasion, strongly estrogen and progesterone receptor positive.  PLAN: We discussed her situation in detail, and the patient understands that at this point what we have is a diagnosis of noninvasive breast cancer. There is a concern regarding invasiveness, which cannot be resolved with the available material. She also understands what it  means for a tumor to be "estrogen receptor positive".  We went over the fact that mastectomy  in cases of noninvasive breast cancer is essentially curative. If she had a mastectomy she would be "done with this problem". She would not require any other treatment for it, assuming indeed we are dealing with noninvasive disease only.  On the other hand, because of her age and concerns regarding mental status (specifically danger from general anesthesia) the patient is considering not having any surgery. In this case we would simply cover with an antiestrogen and follow.  The patient and her son understand this latter option is not standard. It would involve close followup, partly by palpation, and partially through repeated mammography. We then discussed the possible toxicities side effects and complications of antiestrogen, with an emphasis on the issue of blood clots with tamoxifen and bone density loss with the aromatase inhibitors.  After much discussion, we decided we would start on anastrozole and at least for now forego the surgical option. She will see me again in November. If she is tolerating the anastrozole well at that time, we will obtain a bone density and consider zolendronic acid depending on results. She knows to call for any problems that may develop before the next visit.   MAGRINAT,GUSTAV C    07/22/2012

## 2012-07-24 ENCOUNTER — Encounter: Payer: Self-pay | Admitting: *Deleted

## 2012-07-24 NOTE — Progress Notes (Signed)
Mailed after appt letter to pt. 

## 2012-09-10 ENCOUNTER — Telehealth: Payer: Self-pay | Admitting: Oncology

## 2012-09-10 NOTE — Telephone Encounter (Signed)
Mailed the pt an updated appt calendar for november

## 2012-10-04 ENCOUNTER — Ambulatory Visit: Payer: Medicare Other | Admitting: Oncology

## 2012-10-04 ENCOUNTER — Ambulatory Visit (HOSPITAL_BASED_OUTPATIENT_CLINIC_OR_DEPARTMENT_OTHER): Payer: Medicare Other | Admitting: Physician Assistant

## 2012-10-04 ENCOUNTER — Telehealth: Payer: Self-pay | Admitting: Oncology

## 2012-10-04 ENCOUNTER — Encounter: Payer: Self-pay | Admitting: Physician Assistant

## 2012-10-04 VITALS — BP 180/81 | HR 112 | Temp 97.0°F | Resp 20 | Ht 60.0 in | Wt 87.8 lb

## 2012-10-04 DIAGNOSIS — C50419 Malignant neoplasm of upper-outer quadrant of unspecified female breast: Secondary | ICD-10-CM

## 2012-10-04 DIAGNOSIS — Z170 Estrogen receptor positive status [ER+]: Secondary | ICD-10-CM

## 2012-10-04 DIAGNOSIS — D0590 Unspecified type of carcinoma in situ of unspecified breast: Secondary | ICD-10-CM

## 2012-10-04 DIAGNOSIS — Z780 Asymptomatic menopausal state: Secondary | ICD-10-CM

## 2012-10-04 NOTE — Telephone Encounter (Signed)
gve the pt's caregiver the dec mammo/bone density appt along with the dec 2013 appt calendar

## 2012-10-04 NOTE — Progress Notes (Signed)
ID: [name]   DOB: 09-Mar-1925  MR#: [id number]  CSN#:[id number]  PCP: Sara Saupe, MD GYN:  SU:  OTHER MD:   HISTORY OF PRESENT ILLNESS: [name] palpated a mass in her right breast and brought this to her primary physician's attention. She was set up for bilateral diagnostic mammography with right ultrasonography at the breast Center 06/27/2012. There was a posterior high-density mass in the upper outer quadrant of the right breast which was palpable, mobile, and accompanied by a second mass in the right breast and an axillary mass as well. Ultrasound confirmed an oval heterogeneously hypoechoic mass with circumscribed margins in the 9:00 position of the breast measuring 2.1 cm. A second hypoechoic mass was noted closer to the nipple, measuring 1.8 cm. An enlarged lymph node with normal morphology was noted in the axilla.  Biopsy of the 2 breast masses was obtained the same day, and showed ([id number]) ductal carcinoma in situ, with foci suspicious for invasive carcinoma. There were also detached fragments of tissue. Definitive distinction between in situ and invasive tumor could not be made with this material. Tumors were strongly estrogen and progesterone receptor positive (95-100%). The patient's subsequent history is as detailed below.  INTERVAL HISTORY: The patient returns today for followup of her right breast carcinoma, accompanied by her friend and caregiver, [name]. Interval history is remarkable for [facility] having started on anastrozole 2 months ago, 1 mg daily. She is tolerating the medication well and denies any increased joint pain, vaginal dryness, or hot flashes. She is concerned, however, that she feels like the mass in the right breast has gotten larger over the past month.    Also, the patient is currently on an antibiotic and prednisone for recent diagnosis of bronchitis. [name] the urgent care physician ordered a chest x-ray which was obtained yesterday, but  they have not yet heard from the results. This is not in our computerized medical records.   REVIEW OF SYSTEMS: The patient is legally blind.  She has had no recent fevers or chills. She denies any rashes or abnormal bleeding. She's eating and drinking fairly well with no nausea or change in bowel or bladder habits. She does have a cough associated with the bronchitis, and this has caused pain in the left anterior rib cage. She denies any shortness of breath. She has had no chest pain or palpitations. She denies any abnormal headaches or dizziness.  A detailed review of systems is otherwise stable and noncontributory.   PAST MEDICAL HISTORY: Past Medical History  Diagnosis Date  . Cancer     breast  . Psoriasis   . Legally blind     macular degeneration  . Hypertension     PAST SURGICAL HISTORY: Past Surgical History  Procedure Date  . Cholecystectomy   . Spine surgery   . Appendectomy   . Tonsillectomy     FAMILY HISTORY No family history on file. The patient's father died in his 30s from a myocardial infarction. Her mother also died in her 38s from the same cause. The patient had 2 brothers and 6 sisters. There is no history of breast or ovarian cancer in the immediate family.   GYNECOLOGIC HISTORY: The patient is GX P1. First live birth age 73. She does not recall when she went through the change of life. She never took hormone replacement.   SOCIAL HISTORY: Ms. 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 (She  went by [name]). She is divorced, and lives in a retirement community, independent living section. Her son, [name], is a Industrial/product designer judge in [place]. The patient has 2 grandchildren, no greatgrandchildren. She is a International aid/development worker.   ADVANCED DIRECTIVES: The patient's son [name] is her healthcare power of attorney.  HEALTH MAINTENANCE: History  Substance Use Topics  . Smoking status: Never Smoker   . Smokeless tobacco: Never  Used  . Alcohol Use: No     Colonoscopy:  PAP:  Bone density:  Lipid panel:  No Known Allergies  Current Outpatient Prescriptions  Medication Sig Dispense Refill  . anastrozole (ARIMIDEX) 1 MG tablet       . azithromycin (ZITHROMAX) 250 MG tablet       . DERMA-SMOOTHE/FS BODY 0.01 % external oil as needed.      . metoprolol (LOPRESSOR) 50 MG tablet Daily.      . predniSONE (DELTASONE) 20 MG tablet       . PROAIR HFA 108 (90 BASE) MCG/ACT inhaler       . triamcinolone ointment (KENALOG) 0.5 % as needed.        OBJECTIVE: Elderly white woman who appears well  Filed Vitals:   10/04/12 1134  BP: 180/81  Pulse: 112  Temp: 97 F (36.1 C)  Resp: 20     Body mass index is 17.15 kg/(m^2).    ECOG FS: 1 Filed Weights   10/04/12 1134  Weight: 87 lb 12.8 oz (39.826 kg)   Sclerae unicteric Oropharynx clear No cervical or supraclavicular adenopathy Lungs no rales or rhonchi Heart regular rate and rhythm Abd soft, nontender, with positive bowel sounds MSK no focal spinal tenderness, no peripheral edema Neuro: nonfocal, alert and oriented x3 Breasts: There is an easily palpated mass in the lateral edge of the right breast, measuring approximately 2.5 cm in diameter. Left breast is unremarkable. There is fullness noted in the right axilla, and left axilla is unremarkable.    LAB RESULTS: Lab Results  Component Value Date   WBC 6.3 07/19/2012   NEUTROABS 4.6 07/19/2012   HGB 14.0 07/19/2012   HCT 41.6 07/19/2012   MCV 92.0 07/19/2012   PLT 282 07/19/2012      Chemistry      Component Value Date/Time   NA 139 07/19/2012 1008   NA 130* 07/07/2011 1353   K 4.1 07/19/2012 1008   K 3.4* 07/07/2011 1353   CL 103 07/19/2012 1008   CL 94* 07/07/2011 1353   CO2 29 07/19/2012 1008   CO2 31 07/07/2011 1353   BUN 12.0 07/19/2012 1008   BUN 8 07/07/2011 1353   CREATININE 0.8 07/19/2012 1008   CREATININE <0.47* 07/07/2011 1353      Component Value Date/Time   CALCIUM 9.5 07/19/2012 1008    CALCIUM 8.3* 07/07/2011 1353   ALKPHOS 50 07/19/2012 1008   ALKPHOS 63 07/07/2011 1353   AST 24 07/19/2012 1008   AST 16 07/07/2011 1353   ALT 13 07/19/2012 1008   ALT 17 07/07/2011 1353   BILITOT 1.30* 07/19/2012 1008   BILITOT 0.4 07/07/2011 1353       Lab Results  Component Value Date   LABCA2 20 07/19/2012    STUDIES: No results found.    ASSESSMENT: 77 y.o. [place] woman   (1)  status post Right breast biopsy 06/27/2012 for a ductal carcinoma in situ, with possible microinvasion, strongly estrogen and progesterone receptor positive.  (2)  Declined surgery, treated with anastrazole since September 2013  PLAN:  [name] continues to tolerate the anastrozole well, and will continue on this medication. She is concerned that the mass might be enlarging, and in fact she will soon be due for repeat right mammogram. She's also being scheduled for a bone density exam, so we will try to coordinate those 2 appointments. She'll return proximally for 6 weeks for followup with Dr. Darnelle Catalan and review those results.  Depending on the results of the bone density, we may consider adding zoledronic acid.  [name] voices understanding and agreement with this plan. She knows to call for any problems that may develop before the next visit.   Brentley Landfair    10/04/2012

## 2012-10-12 NOTE — Progress Notes (Signed)
No show

## 2012-10-16 ENCOUNTER — Encounter (INDEPENDENT_AMBULATORY_CARE_PROVIDER_SITE_OTHER): Payer: Self-pay | Admitting: General Surgery

## 2012-10-23 ENCOUNTER — Other Ambulatory Visit: Payer: Medicare Other

## 2012-11-01 ENCOUNTER — Other Ambulatory Visit: Payer: Self-pay | Admitting: Physician Assistant

## 2012-11-01 ENCOUNTER — Ambulatory Visit
Admission: RE | Admit: 2012-11-01 | Discharge: 2012-11-01 | Disposition: A | Discharge status: Home / Self Care | Payer: Medicare Other | Source: Ambulatory Visit | Attending: Physician Assistant | Admitting: Physician Assistant

## 2012-11-01 DIAGNOSIS — C50419 Malignant neoplasm of upper-outer quadrant of unspecified female breast: Secondary | ICD-10-CM

## 2012-11-01 DIAGNOSIS — Z780 Asymptomatic menopausal state: Secondary | ICD-10-CM

## 2012-11-11 ENCOUNTER — Other Ambulatory Visit (HOSPITAL_BASED_OUTPATIENT_CLINIC_OR_DEPARTMENT_OTHER): Payer: Medicare Other | Admitting: Lab

## 2012-11-11 ENCOUNTER — Ambulatory Visit (HOSPITAL_BASED_OUTPATIENT_CLINIC_OR_DEPARTMENT_OTHER): Payer: Medicare Other | Admitting: Oncology

## 2012-11-11 ENCOUNTER — Telehealth: Payer: Self-pay | Admitting: *Deleted

## 2012-11-11 VITALS — BP 170/79 | HR 89 | Temp 97.4°F | Resp 20 | Ht 60.0 in | Wt 93.8 lb

## 2012-11-11 DIAGNOSIS — D0590 Unspecified type of carcinoma in situ of unspecified breast: Secondary | ICD-10-CM

## 2012-11-11 DIAGNOSIS — C50419 Malignant neoplasm of upper-outer quadrant of unspecified female breast: Secondary | ICD-10-CM

## 2012-11-11 DIAGNOSIS — M810 Age-related osteoporosis without current pathological fracture: Secondary | ICD-10-CM

## 2012-11-11 DIAGNOSIS — Z170 Estrogen receptor positive status [ER+]: Secondary | ICD-10-CM

## 2012-11-11 LAB — COMPREHENSIVE METABOLIC PANEL (CC13)
ALT: 16 U/L (ref 0–55)
BUN: 12 mg/dL (ref 7.0–26.0)
CO2: 30 mEq/L — ABNORMAL HIGH (ref 22–29)
Calcium: 8.5 mg/dL (ref 8.4–10.4)
Chloride: 104 mEq/L (ref 98–107)
Creatinine: 0.7 mg/dL (ref 0.6–1.1)
Glucose: 85 mg/dl (ref 70–99)
Total Bilirubin: 0.57 mg/dL (ref 0.20–1.20)

## 2012-11-11 LAB — CBC WITH DIFFERENTIAL/PLATELET
BASO%: 0.5 % (ref 0.0–2.0)
Basophils Absolute: 0 10*3/uL (ref 0.0–0.1)
HCT: 38.1 % (ref 34.8–46.6)
HGB: 13 g/dL (ref 11.6–15.9)
LYMPH%: 10.6 % — ABNORMAL LOW (ref 14.0–49.7)
MCH: 31.8 pg (ref 25.1–34.0)
MCHC: 34.1 g/dL (ref 31.5–36.0)
MONO#: 1.3 10*3/uL — ABNORMAL HIGH (ref 0.1–0.9)
NEUT%: 66.6 % (ref 38.4–76.8)
Platelets: 307 10*3/uL (ref 145–400)
WBC: 7.7 10*3/uL (ref 3.9–10.3)

## 2012-11-11 NOTE — Telephone Encounter (Signed)
zometa jan 21 at 3 PM; see berry in 3 months, labs same day

## 2012-11-11 NOTE — Progress Notes (Signed)
ID: [name]   DOB: 04/16/1925  MR#: [id number]  WUJ#:[id number]  PCP: Cain Saupe, MD GYN:  SU:  OTHER MD:   HISTORY OF PRESENT ILLNESS: Ms. [name] palpated a mass in her right breast and brought this to her primary physician's attention. She was set up for bilateral diagnostic mammography with right ultrasonography at the [facility] Center 06/27/2012. There was a posterior high-density mass in the upper outer quadrant of the right breast which was palpable, mobile, and accompanied by a second mass in the right breast and an axillary mass as well. Ultrasound confirmed an oval heterogeneously hypoechoic mass with circumscribed margins in the 9:00 position of the breast measuring 2.1 cm. A second hypoechoic mass was noted closer to the nipple, measuring 1.8 cm. An enlarged lymph node with normal morphology was noted in the axilla.  Biopsy of the 2 breast masses was obtained the same day, and showed ([id number]) ductal carcinoma in situ, with foci suspicious for invasive carcinoma. There were also detached fragments of tissue. Definitive distinction between in situ and invasive tumor could not be made with this material. Tumors were strongly estrogen and progesterone receptor positive (95-100%). The patient's subsequent history is as detailed below.  INTERVAL HISTORY: The patient returns today for followup of her right breast carcinoma, accompanied by her friend and caregiver, [name]. [name] is tolerating the anastrozole with no side effects that she is aware of. She has a hard time telling whether the mass in her right breast is getting bigger or smaller. She is looking forward to the holidays, when her son from [place] will be visiting  REVIEW OF SYSTEMS: The patient is legally blind. There have been no headaches, visual changes, cough, phlegm production, pleurisy, shortness of breath, change in bowel or bladder habits, bleeding, fever, or pain. Her psoriasis "drives me crazy", but she has  a ready seen numerous dermatologists and quoted always just comes right back". A detailed review of systems today was otherwise noncontributory   PAST MEDICAL HISTORY: Past Medical History  Diagnosis Date  . Cancer     breast  . Psoriasis   . Legally blind     macular degeneration  . Hypertension     PAST SURGICAL HISTORY: Past Surgical History  Procedure Date  . Cholecystectomy   . Spine surgery   . Appendectomy   . Tonsillectomy     FAMILY HISTORY No family history on file. The patient's father died in his 61s from a myocardial infarction. Her mother also died in her 80s from the same cause. The patient had 2 brothers and 6 sisters. There is no history of breast or ovarian cancer in the immediate family.   GYNECOLOGIC HISTORY: The patient is GX P1. First live birth age 64. She does not recall when she went through the change of life. She never took hormone replacement.   SOCIAL HISTORY: Ms. 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 (She went by "[name]"). She is divorced, and lives in a retirement community, independent living section. Her son, [name], is a Industrial/product designer judge in [place]. The patient has 2 grandchildren, no greatgrandchildren. She is a International aid/development worker.   ADVANCED DIRECTIVES: The patient's son [name] is her healthcare power of attorney.  HEALTH MAINTENANCE: History  Substance Use Topics  . Smoking status: Never Smoker   . Smokeless tobacco: Never Used  . Alcohol Use: No     Colonoscopy:  PAP:  Bone density:  Lipid  panel:  No Known Allergies  Current Outpatient Prescriptions  Medication Sig Dispense Refill  . anastrozole (ARIMIDEX) 1 MG tablet       . azithromycin (ZITHROMAX) 250 MG tablet       . DERMA-SMOOTHE/FS BODY 0.01 % external oil as needed.      . metoprolol (LOPRESSOR) 50 MG tablet Daily.      . predniSONE (DELTASONE) 20 MG tablet       . PROAIR HFA 108 (90 BASE) MCG/ACT inhaler       .  triamcinolone ointment (KENALOG) 0.5 % as needed.        OBJECTIVE: Elderly white woman who appears well  Filed Vitals:   11/11/12 1512  BP: 170/79  Pulse: 89  Temp: 97.4 F (36.3 C)  Resp: 20     Body mass index is 18.32 kg/(m^2).    ECOG FS: 1 Filed Weights   11/11/12 1512  Weight: 93 lb 12.8 oz (42.547 kg)   Sclerae unicteric Oropharynx clear, not to dry No cervical or supraclavicular adenopathy Lungs no rales or rhonchi Heart regular rate and rhythm Abd soft, nontender, positive bowel sounds MSK no focal spinal tenderness, no peripheral edema Neuro: nonfocal, alert and oriented x3 Breasts: There is an easily palpated mass in the lateral edge of the right breast, measuring approximately 1.2 cm in diameter. Left breast is unremarkable. I do not palpate a mass in either axilla   LAB RESULTS: Lab Results  Component Value Date   WBC 7.7 11/11/2012   NEUTROABS 5.2 11/11/2012   HGB 13.0 11/11/2012   HCT 38.1 11/11/2012   MCV 93.2 11/11/2012   PLT 307 11/11/2012      Chemistry      Component Value Date/Time   NA 142 11/11/2012 1413   NA 130* 07/07/2011 1353   K 3.8 11/11/2012 1413   K 3.4* 07/07/2011 1353   CL 104 11/11/2012 1413   CL 94* 07/07/2011 1353   CO2 30* 11/11/2012 1413   CO2 31 07/07/2011 1353   BUN 12.0 11/11/2012 1413   BUN 8 07/07/2011 1353   CREATININE 0.7 11/11/2012 1413   CREATININE <0.47* 07/07/2011 1353      Component Value Date/Time   CALCIUM 8.5 11/11/2012 1413   CALCIUM 8.3* 07/07/2011 1353   ALKPHOS 53 11/11/2012 1413   ALKPHOS 63 07/07/2011 1353   AST 22 11/11/2012 1413   AST 16 07/07/2011 1353   ALT 16 11/11/2012 1413   ALT 17 07/07/2011 1353   BILITOT 0.57 11/11/2012 1413   BILITOT 0.4 07/07/2011 1353       Lab Results  Component Value Date   LABCA2 20 07/19/2012    STUDIES: US Breast Right  11/01/2012  *RADIOLOGY REPORT*  Clinical Data:  Carcinoma of the right breast diagnosed in August 2013.  The patient is currently on  Arimidex.  Follow-up evaluation.  DIGITAL DIAGNOSTIC RIGHT BREAST MAMMOGRAM WITH CAD AND RIGHT BREAST ULTRASOUND:  Comparison:  06/27/2012.  Findings:  ACR Breast Density Category 3: The breast tissue is heterogeneously dense.  There is a heterogeneously dense parenchymal pattern.  The masses located laterally within the right breast at the 9 o'clock position could not be adequately visualized on mammography due to the relatively posterior location within the breasts and the patient's small breast size (despite tangential views).  Comparison of the size of the  masses will be better evaluated by ultrasound.  There are no new additional findings within the right breast.  Mammographic images were processed with  CAD.  On physical exam, there are two small firm palpable masses located within the right breast at the 9 o'clock position 2 and 4 cm from the nipple.  Ultrasound is performed, showing a mass located within the 9 o'clock position of the right breast 2 cm from nipple which on today's study measures 1.3 x 1.0 x 0.5 cm in size (previously 1.9 x 0.9 x 0.7 cm in size).  The second more laterally located mass located within the right breast at the 9 o'clock position 4 cm from nipple now measures 1.1 x 1.2 x 0.7 cm in size (previously 2.1 x 1.4 x 0.8 cm in size).  There are no additional findings.  IMPRESSION: Decrease in size of the masses located within the right breast at the 9 o'clock position 2 and 4 cm from the nipple as discussed above.  No additional findings.  RECOMMENDATION: Treatment plan.  I have discussed the findings and recommendations with the patient. Results were also provided in writing at the conclusion of the visit.  BI-RADS CATEGORY 6:  Known biopsy-proven malignancy - appropriate action should be taken.   Original Report Authenticated By: Rolla Plate, M.D.    Dg Bone Density  11/01/2012  *RADIOLOGY REPORT*  Clinical Data: 78 year old post menopausal female with a history of breast  cancer, currently taking Arimidex and anastazole.  DUAL X-RAY ABSORPTIOMETRY (DXA) FOR BONE MINERAL DENSITY  AP LUMBAR SPINE L1-L4  Bone Mineral Density (BMD):            0.767 g/cm2 Young Adult T Score:                          -2.5 Z Score:                                                Not calculated at the patient's age  LEFT FEMUR NECK  Bone Mineral Density (BMD):             0.497 g/cm2 Young Adult T Score:                           -3.2 Z Score:                                                 Not calculated at the patient's age  ASSESSMENT:  Patient's diagnostic category is OSTEOPOROSIS by WHO Criteria.  FRACTURE RISK: INCREASED  FRAX: World Health Organization FRAX assessment of absolute fracture risk is not calculated for this patient because the patient has osteoporosis.  Comparison: None  RECOMMENDATIONS:  Effective therapies are available in the form of bisphosphonates, selective estrogen receptor modulators, biologic agents, and hormone replacement therapy (for women).  All patients should ensure an adequate intake of dietary calcium (1200mg  daily) and vitamin D (800 IU daily) unless contraindicated.  All treatment decisions require clinical judgement and consideration of individual patient factors, including patient preferences, co-morbidities, previous drug use, risk factors not captured in the FRAX model (e.g., frailty, falls, vitamin D deficiency, increased bone turnover, interval significant decline in bone density) and possible under-or over-estimation of fracture risk by FRAX.  The National Osteoporosis Foundation recommends  that FDA-approved medical therapies be considered in postmenopausal women and mean age 36 or older with a:        1)     Hip or vertebral (clinical or morphometric) fracture.           2)    T-score of -2.5 or lower at the spine or hip. 3)    Ten-year fracture probability by FRAX of 3% or greater for hip fracture or 20% or greater for major osteoporotic fracture. FOLLOW-UP:   People with diagnosed cases of osteoporosis or at high risk for fracture should have regular bone mineral density tests.  For patients eligible for Medicare, routine testing is allowed once every 2 years.  The testing frequency can be increased to one year for patients who have rapidly progressing disease, those who are receiving or discontinuing medical therapy to restore bone mass, or have additional risk factors.  World Science writer [facility]) Criteria:  Normal: T scores from +1.0 to -1.0 Low Bone Mass (Osteopenia): T scores between -1.0 and -2.5 Osteoporosis: T scores -2.5 and below  Comparison to Reference Population:  T score is the key measure used in the diagnosis of osteoporosis and relative risk determination for fracture.  It provides a value for bone mass relative to the mean bone mass of a young adult reference population expressed in terms of standard deviation (SD).  Z score is the age-matched score showing the patient's values compared to a population matched for age, sex, and race.  This is also expressed in terms of standard deviation.  The patient may have values that compare favorably to the age-matched values and still be at increased risk for fracture.   Original Report Authenticated By: Christiana Pellant, M.D.    Mm Digital Diagnostic Unilat R  11/01/2012  *RADIOLOGY REPORT*  Clinical Data:  Carcinoma of the right breast diagnosed in August 2013.  The patient is currently on Arimidex.  Follow-up evaluation.  DIGITAL DIAGNOSTIC RIGHT BREAST MAMMOGRAM WITH CAD AND RIGHT BREAST ULTRASOUND:  Comparison:  06/27/2012.  Findings:  ACR Breast Density Category 3: The breast tissue is heterogeneously dense.  There is a heterogeneously dense parenchymal pattern.  The masses located laterally within the right breast at the 9 o'clock position could not be adequately visualized on mammography due to the relatively posterior location within the breasts and the patient's small breast size (despite  tangential views).  Comparison of the size of the  masses will be better evaluated by ultrasound.  There are no new additional findings within the right breast.  Mammographic images were processed with CAD.  On physical exam, there are two small firm palpable masses located within the right breast at the 9 o'clock position 2 and 4 cm from the nipple.  Ultrasound is performed, showing a mass located within the 9 o'clock position of the right breast 2 cm from nipple which on today's study measures 1.3 x 1.0 x 0.5 cm in size (previously 1.9 x 0.9 x 0.7 cm in size).  The second more laterally located mass located within the right breast at the 9 o'clock position 4 cm from nipple now measures 1.1 x 1.2 x 0.7 cm in size (previously 2.1 x 1.4 x 0.8 cm in size).  There are no additional findings.  IMPRESSION: Decrease in size of the masses located within the right breast at the 9 o'clock position 2 and 4 cm from the nipple as discussed above.  No additional findings.  RECOMMENDATION: Treatment plan.  I have discussed  the findings and recommendations with the patient. Results were also provided in writing at the conclusion of the visit.  BI-RADS CATEGORY 6:  Known biopsy-proven malignancy - appropriate action should be taken.   Original Report Authenticated By: Rolla Plate, M.D.       ASSESSMENT: 78 y.o. [place] woman   (1)  status post Right breast biopsy 06/27/2012 for a ductal carcinoma in situ, with possible microinvasion, strongly estrogen and progesterone receptor positive.  (2)  Declined surgery, treated with anastrazole since September 2013  (3) osteoporosis documented by bone density December 2013   PLAN:  [name] continues to tolerate the anastrozole well, and is having an excellent initial response. We will follow the mass by palpation in 3 months and assuming it has at least not grown we will repeat mammography and ultrasonography of the right breast in 6 months.  We talked about the  osteoporosis of extensively. She understands that anastrozole can worsen this problem. She will benefit from bisphosphonates and we talked about the difference between oral and intravenous bisphosphonates. She would prefer to receive her week last, since it is only once a year. I have asked her to take sometimes the day of treatment since that frequently laminates the dizziness or flulike symptoms some patients can experience. She is aware of the possibility of osteonecrosis of the jaw, but she really has a full dentures upper outer lower, with 2 implants that have been in place for many years. I think her chance of developing this problem would be quite low  She will return for zoledronic acid (Reclast) January 21, and then again to see [place] in 3 months. She knows to call for any problems that may develop before the next visit. MAGRINAT,GUSTAV C    11/11/2012

## 2012-11-12 ENCOUNTER — Telehealth: Payer: Self-pay | Admitting: *Deleted

## 2012-11-12 NOTE — Telephone Encounter (Signed)
Per staff message and POF I have scheduled appts.  JMW  

## 2012-12-10 ENCOUNTER — Ambulatory Visit: Payer: Medicare Other

## 2012-12-12 ENCOUNTER — Ambulatory Visit (HOSPITAL_BASED_OUTPATIENT_CLINIC_OR_DEPARTMENT_OTHER): Payer: Medicare Other

## 2012-12-12 VITALS — BP 167/74 | HR 98 | Temp 97.9°F | Resp 20

## 2012-12-12 DIAGNOSIS — M810 Age-related osteoporosis without current pathological fracture: Secondary | ICD-10-CM

## 2012-12-12 DIAGNOSIS — C50419 Malignant neoplasm of upper-outer quadrant of unspecified female breast: Secondary | ICD-10-CM

## 2012-12-12 MED ORDER — ZOLEDRONIC ACID 4 MG/5ML IV CONC
4.0000 mg | Freq: Once | INTRAVENOUS | Status: AC
  Administered 2012-12-12: 4 mg via INTRAVENOUS
  Filled 2012-12-12: qty 5

## 2012-12-12 MED ORDER — SODIUM CHLORIDE 0.9 % IV SOLN: Freq: Once | INTRAVENOUS | Status: DC

## 2012-12-12 NOTE — Patient Instructions (Addendum)
[facility] Discharge Instructions for Patients  Today you received the following agents zometa  To help prevent nausea and vomiting after your treatment, we encourage you to take your nausea medication and take it as often as prescribed   If you develop nausea and vomiting that is not controlled by your nausea medication, call the clinic. If it is after clinic hours your family physician or the after hours number for the clinic or go to the Emergency Department.   BELOW ARE SYMPTOMS THAT SHOULD BE REPORTED IMMEDIATELY:  *FEVER GREATER THAN 100.5 F  *CHILLS WITH OR WITHOUT FEVER  NAUSEA AND VOMITING THAT IS NOT CONTROLLED WITH YOUR NAUSEA MEDICATION  *UNUSUAL SHORTNESS OF BREATH  *UNUSUAL BRUISING OR BLEEDING  TENDERNESS IN MOUTH AND THROAT WITH OR WITHOUT PRESENCE OF ULCERS  *URINARY PROBLEMS  *BOWEL PROBLEMS  UNUSUAL RASH Items with * indicate a potential emergency and should be followed up as soon as possible.  One of the nurses will contact you 24 hours after your treatment. Please let the nurse know about any problems that you may have experienced. Feel free to call the clinic you have any questions or concerns. The clinic phone number is [phone].   I have been informed and understand all the instructions given to me. I know to contact the clinic, my physician, or go to the Emergency Department if any problems should occur. I do not have any questions at this time, but understand that I may call the clinic during office hours   should I have any questions or need assistance in obtaining follow up care.    __________________________________________  _____________  __________ Signature of Patient or Authorized Representative            Date                   Time    __________________________________________ Nurse's Signature

## 2013-01-16 ENCOUNTER — Encounter: Payer: Self-pay | Admitting: Cardiology

## 2013-01-21 ENCOUNTER — Other Ambulatory Visit: Payer: Self-pay | Admitting: Physician Assistant

## 2013-01-22 ENCOUNTER — Telehealth: Payer: Self-pay | Admitting: Oncology

## 2013-01-22 NOTE — Telephone Encounter (Signed)
Moved 3/24 appt to 3/25 per AB - poof. Not able to reach pt by phone or lm. Mailed schedule and added comment to 3/17 lb appt asking that pt be sent for new schedule.

## 2013-01-31 ENCOUNTER — Other Ambulatory Visit: Payer: Self-pay | Admitting: *Deleted

## 2013-01-31 DIAGNOSIS — C50419 Malignant neoplasm of upper-outer quadrant of unspecified female breast: Secondary | ICD-10-CM

## 2013-02-03 ENCOUNTER — Other Ambulatory Visit (HOSPITAL_BASED_OUTPATIENT_CLINIC_OR_DEPARTMENT_OTHER): Payer: Medicare Other | Admitting: Lab

## 2013-02-03 DIAGNOSIS — C50419 Malignant neoplasm of upper-outer quadrant of unspecified female breast: Secondary | ICD-10-CM

## 2013-02-03 DIAGNOSIS — D0590 Unspecified type of carcinoma in situ of unspecified breast: Secondary | ICD-10-CM

## 2013-02-03 LAB — CBC WITH DIFFERENTIAL/PLATELET
BASO%: 0.5 % (ref 0.0–2.0)
Basophils Absolute: 0 10*3/uL (ref 0.0–0.1)
Eosinophils Absolute: 0.3 10*3/uL (ref 0.0–0.5)
HCT: 42.2 % (ref 34.8–46.6)
HGB: 14.1 g/dL (ref 11.6–15.9)
LYMPH%: 14.4 % (ref 14.0–49.7)
MONO#: 1 10*3/uL — ABNORMAL HIGH (ref 0.1–0.9)
NEUT#: 3.8 10*3/uL (ref 1.5–6.5)
NEUT%: 64.4 % (ref 38.4–76.8)
Platelets: 223 10*3/uL (ref 145–400)
WBC: 5.9 10*3/uL (ref 3.9–10.3)
lymph#: 0.9 10*3/uL (ref 0.9–3.3)

## 2013-02-03 LAB — COMPREHENSIVE METABOLIC PANEL (CC13)
ALT: 12 U/L (ref 0–55)
CO2: 27 mEq/L (ref 22–29)
Calcium: 8.4 mg/dL (ref 8.4–10.4)
Chloride: 107 mEq/L (ref 98–107)
Creatinine: 0.8 mg/dL (ref 0.6–1.1)
Glucose: 92 mg/dl (ref 70–99)
Total Bilirubin: 0.78 mg/dL (ref 0.20–1.20)

## 2013-02-10 ENCOUNTER — Ambulatory Visit: Payer: Medicare Other | Admitting: Physician Assistant

## 2013-02-11 ENCOUNTER — Telehealth: Payer: Self-pay | Admitting: Oncology

## 2013-02-11 ENCOUNTER — Encounter: Payer: Self-pay | Admitting: Physician Assistant

## 2013-02-11 ENCOUNTER — Ambulatory Visit (HOSPITAL_BASED_OUTPATIENT_CLINIC_OR_DEPARTMENT_OTHER): Payer: Medicare Other | Admitting: Physician Assistant

## 2013-02-11 VITALS — BP 133/61 | HR 86 | Temp 98.0°F | Resp 20 | Ht 60.0 in | Wt 98.1 lb

## 2013-02-11 DIAGNOSIS — D0590 Unspecified type of carcinoma in situ of unspecified breast: Secondary | ICD-10-CM

## 2013-02-11 DIAGNOSIS — C50411 Malignant neoplasm of upper-outer quadrant of right female breast: Secondary | ICD-10-CM

## 2013-02-11 DIAGNOSIS — M810 Age-related osteoporosis without current pathological fracture: Secondary | ICD-10-CM

## 2013-02-11 DIAGNOSIS — Z170 Estrogen receptor positive status [ER+]: Secondary | ICD-10-CM

## 2013-02-11 NOTE — Telephone Encounter (Signed)
, °

## 2013-02-11 NOTE — Progress Notes (Signed)
ID: [name]   DOB: 1925/01/03  MR#: [id number]  JYN#:[id number]  PCP: Cain Saupe, MD GYN:  SU:  OTHER MD:   HISTORY OF PRESENT ILLNESS: [name] palpated a mass in her right breast and brought this to her primary physician's attention. She was set up for bilateral diagnostic mammography with right ultrasonography at the [facility] 06/27/2012. There was a posterior high-density mass in the upper outer quadrant of the right breast which was palpable, mobile, and accompanied by a second mass in the right breast and an axillary mass as well. Ultrasound confirmed an oval heterogeneously hypoechoic mass with circumscribed margins in the 9:00 position of the breast measuring 2.1 cm. A second hypoechoic mass was noted closer to the nipple, measuring 1.8 cm. An enlarged lymph node with normal morphology was noted in the axilla.  Biopsy of the 2 breast masses was obtained the same day, and showed ([id number]) ductal carcinoma in situ, with foci suspicious for invasive carcinoma. There were also detached fragments of tissue. Definitive distinction between in situ and invasive tumor could not be made with this material. Tumors were strongly estrogen and progesterone receptor positive (95-100%). The patient's subsequent history is as detailed below.  INTERVAL HISTORY: The patient returns today accompanied by her friend and caregiver for followup of [name]'s right breast carcinoma. She continues to tolerate the anastrozole with no side effects that she is aware of. She tells me the mass in her right breast feels smaller, and she notes that the softness of the mass varies from day-to-day, making it more difficult to palpate at certain times.  REVIEW OF SYSTEMS: [name] has had no fevers, chills, night sweats, or hot flashes. She's had no rashes or skin changes and denies abnormal bleeding. She's had no abnormal headaches or dizziness. The patient is legally blind. There has been no  cough, phlegm  production, pleurisy, shortness of breath,  chest pain, or change in bowel or bladder habits. She denies any unusual myalgias, arthralgias, or bony pain, and has had no peripheral swelling.  A detailed review of systems is otherwise stable and noncontributory.    PAST MEDICAL HISTORY: Past Medical History  Diagnosis Date  . Cancer     breast  . Psoriasis   . Legally blind     macular degeneration  . Hypertension   . CAD (coronary artery disease) 2007    Last cath 2007, Last echo-09/01/10 EF >55%, RV Systolic pressure30-40 mmHg;Last Nuc 2006 no ischemia  . Pulmonary HTN     mild  . Seizure disorder        . H/O syncope     remotely, no etiology found, mild carotid disease on doppler, 0-49% bil    PAST SURGICAL HISTORY: Past Surgical History  Procedure Laterality Date  . Cholecystectomy    . Spine surgery    . Appendectomy    . Tonsillectomy    . Cardiac catheterization  11/22/2005    RCA with 20-30% eccentric narrowing other vessels free of disease    FAMILY HISTORY Family History  Problem Relation Age of Onset  . Pulmonary embolism Mother 27    caused her death  . CAD Father 66    caused his death  . Stroke Father   . Leukemia Brother 60    cause of death  . CAD Brother   . CAD Brother    The patient's father died in his 103s from a myocardial infarction. Her mother also died in her 25s from the  same cause. The patient had 2 brothers and 6 sisters. There is no history of breast or ovarian cancer in the immediate family.   GYNECOLOGIC HISTORY: The patient is GX P1. First live birth age 83. She does not recall when she went through the change of life. She never took hormone replacement.   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 (She went by "[name]"). She is divorced, and lives in a retirement community, independent living section. Her son, [name], is a Industrial/product designer judge in [place]. The patient has  2 grandchildren, no greatgrandchildren. She is a International aid/development worker.   ADVANCED DIRECTIVES: The patient's son [name] is her healthcare power of attorney.  HEALTH MAINTENANCE: History  Substance Use Topics  . Smoking status: Former Smoker    Quit date: 11/20/1994  . Smokeless tobacco: Former Neurosurgeon    Quit date: 11/20/1994  . Alcohol Use: No     Colonoscopy:  PAP:  Bone density:  Lipid panel:  Allergies  Allergen Reactions  . Ephedra (Ephedrine)   . Captopril Rash  . Phenobarbital Rash    Current Outpatient Prescriptions  Medication Sig Dispense Refill  . anastrozole (ARIMIDEX) 1 MG tablet       . loratadine (CLARITIN) 10 MG tablet Take 10 mg by mouth daily as needed for allergies.      . metoprolol (LOPRESSOR) 50 MG tablet Daily.       No current facility-administered medications for this visit.    OBJECTIVE: Elderly white woman who appears well  Filed Vitals:   02/11/13 1509  BP: 133/61  Pulse: 86  Temp: 98 F (36.7 C)  Resp: 20     Body mass index is 19.16 kg/(m^2).    ECOG FS: 1 Filed Weights   02/11/13 1509  Weight: 98 lb 1.6 oz (44.498 kg)   Sclerae unicteric Oropharynx clear No cervical or supraclavicular adenopathy Lungs clear to auscultation, no rales or rhonchi Heart regular rate and rhythm Abd soft, nontender, positive bowel sounds MSK no focal spinal tenderness, no peripheral edema Neuro: nonfocal, well oriented with positive affect Breasts: There is an easily palpated mass in the lateral edge of the right breast, freely movable, measuring approximately 1.0 cm in diameter. Left breast is unremarkable. I do not palpate a mass in either axilla   LAB RESULTS: Lab Results  Component Value Date   WBC 5.9 02/03/2013   NEUTROABS 3.8 02/03/2013   HGB 14.1 02/03/2013   HCT 42.2 02/03/2013   MCV 92.1 02/03/2013   PLT 223 02/03/2013      Chemistry      Component Value Date/Time   NA 140 02/03/2013 1433   NA 130* 07/07/2011 1353   K 4.3 02/03/2013 1433   K 3.4*  07/07/2011 1353   CL 107 02/03/2013 1433   CL 94* 07/07/2011 1353   CO2 27 02/03/2013 1433   CO2 31 07/07/2011 1353   BUN 13.0 02/03/2013 1433   BUN 8 07/07/2011 1353   CREATININE 0.8 02/03/2013 1433   CREATININE <0.47* 07/07/2011 1353      Component Value Date/Time   CALCIUM 8.4 02/03/2013 1433   CALCIUM 8.3* 07/07/2011 1353   ALKPHOS 42 02/03/2013 1433   ALKPHOS 63 07/07/2011 1353   AST 19 02/03/2013 1433   AST 16 07/07/2011 1353   ALT 12 02/03/2013 1433   ALT 17 07/07/2011 1353   BILITOT 0.78 02/03/2013 1433   BILITOT 0.4 07/07/2011 1353  Lab Results  Component Value Date   LABCA2 20 07/19/2012    STUDIES:  No results found.    ASSESSMENT: 79 y.o. [place] woman   (1)  status post Right breast biopsy 06/27/2012 for a ductal carcinoma in situ, with possible microinvasion, strongly estrogen and progesterone receptor positive.  (2)  Declined surgery, treated with anastrazole since September 2013  (3) osteoporosis documented by bone density December 2013;  receiving zoledronic acid, first dose given in January 2014   PLAN:  Matisha continues to tolerate the anastrozole well, and appears to be having having an excellent initial response. We will repeat mammography and ultrasonography of the right breast in June, after which she will be seeing Dr. Darnelle Catalan for reevaluation.  She voices understanding and agreement with this plan, and will call with any changes or problems.  Of note, she will continue to receive zoledronic acid or an annual basis, not due again until January 2015.   Selisa Tensley    02/11/2013

## 2013-04-24 ENCOUNTER — Ambulatory Visit
Admission: RE | Admit: 2013-04-24 | Discharge: 2013-04-24 | Disposition: A | Discharge status: Home / Self Care | Payer: Medicare Other | Source: Ambulatory Visit | Attending: Physician Assistant | Admitting: Physician Assistant

## 2013-04-24 ENCOUNTER — Other Ambulatory Visit (HOSPITAL_BASED_OUTPATIENT_CLINIC_OR_DEPARTMENT_OTHER): Payer: Medicare Other | Admitting: Lab

## 2013-04-24 DIAGNOSIS — C50411 Malignant neoplasm of upper-outer quadrant of right female breast: Secondary | ICD-10-CM

## 2013-04-24 DIAGNOSIS — D0590 Unspecified type of carcinoma in situ of unspecified breast: Secondary | ICD-10-CM

## 2013-04-24 LAB — CBC WITH DIFFERENTIAL/PLATELET
BASO%: 0.7 % (ref 0.0–2.0)
Basophils Absolute: 0.1 10*3/uL (ref 0.0–0.1)
EOS%: 7.5 % — ABNORMAL HIGH (ref 0.0–7.0)
HCT: 41.1 % (ref 34.8–46.6)
LYMPH%: 11.7 % — ABNORMAL LOW (ref 14.0–49.7)
MCH: 30 pg (ref 25.1–34.0)
MCHC: 32.6 g/dL (ref 31.5–36.0)
MONO#: 0.9 10*3/uL (ref 0.1–0.9)
NEUT%: 66.4 % (ref 38.4–76.8)
Platelets: 250 10*3/uL (ref 145–400)

## 2013-04-24 LAB — COMPREHENSIVE METABOLIC PANEL (CC13)
ALT: 11 U/L (ref 0–55)
BUN: 12.8 mg/dL (ref 7.0–26.0)
CO2: 28 mEq/L (ref 22–29)
Creatinine: 0.8 mg/dL (ref 0.6–1.1)
Total Bilirubin: 1.16 mg/dL (ref 0.20–1.20)

## 2013-04-24 LAB — TECHNOLOGIST REVIEW

## 2013-05-01 ENCOUNTER — Ambulatory Visit (HOSPITAL_BASED_OUTPATIENT_CLINIC_OR_DEPARTMENT_OTHER): Payer: Medicare Other | Admitting: Physician Assistant

## 2013-05-01 ENCOUNTER — Telehealth: Payer: Self-pay | Admitting: *Deleted

## 2013-05-01 ENCOUNTER — Encounter: Payer: Self-pay | Admitting: Physician Assistant

## 2013-05-01 ENCOUNTER — Ambulatory Visit: Payer: Medicare Other | Admitting: Oncology

## 2013-05-01 VITALS — BP 158/60 | HR 63 | Temp 97.6°F | Resp 20 | Ht 60.0 in | Wt 105.2 lb

## 2013-05-01 DIAGNOSIS — Z170 Estrogen receptor positive status [ER+]: Secondary | ICD-10-CM

## 2013-05-01 DIAGNOSIS — M810 Age-related osteoporosis without current pathological fracture: Secondary | ICD-10-CM

## 2013-05-01 DIAGNOSIS — D0590 Unspecified type of carcinoma in situ of unspecified breast: Secondary | ICD-10-CM

## 2013-05-01 DIAGNOSIS — C50411 Malignant neoplasm of upper-outer quadrant of right female breast: Secondary | ICD-10-CM

## 2013-05-01 NOTE — Telephone Encounter (Signed)
appts made and printed...td 

## 2013-05-01 NOTE — Progress Notes (Signed)
ID: [name]   DOB: [birth year]  MR#: [id number]  NWG#:[id number]  PCP: Sara Saupe, MD GYN:  SU:  OTHER MD:   HISTORY OF PRESENT ILLNESS: Ms. [name] palpated a mass in her right breast and brought this to her primary physician's attention. She was set up for bilateral diagnostic mammography with right ultrasonography at [name] 06/27/2012. There was a posterior high-density mass in the upper outer quadrant of the right breast which was palpable, mobile, and accompanied by a second mass in the right breast and an axillary mass as well. Ultrasound confirmed an oval heterogeneously hypoechoic mass with circumscribed margins in the 9:00 position of the breast measuring 2.1 cm. A second hypoechoic mass was noted closer to the nipple, measuring 1.8 cm. An enlarged lymph node with normal morphology was noted in the axilla.  Biopsy of the 2 breast masses was obtained the same day, and showed ([id number]) ductal carcinoma in situ, with foci suspicious for invasive carcinoma. There were also detached fragments of tissue. Definitive distinction between in situ and invasive tumor could not be made with this material. Tumors were strongly estrogen and progesterone receptor positive (95-100%). The patient's subsequent history is as detailed below.  INTERVAL HISTORY: "[name]"  returns today accompanied by her friend and caregiver, [name], for followup of [name]'s right breast carcinoma. Recall that she declined surgery at time of diagnosis, and continues on anastrozole.  A recent right mammogram and breast ultrasound confirmed stability/slight improvement in the known masses in the right breast.   She is tolerating the anastrozole very well, and in fact has no associated side effects that she is aware of. Specifically, she denies any hot flashes, vaginal dryness, or increased joint pain. Her only complaint today, in fact, is some arthritic pain in the right hand which is being followed by her primary care  physician.   REVIEW OF SYSTEMS: [name] has had no fevers, chills, or night sweats. She's had no rashes or skin changes and denies abnormal bleeding. She's had no abnormal headaches or dizziness. The patient is legally blind. There has been no  cough, phlegm production, pleurisy, shortness of breath,  chest pain, or palpitations. She's had no nausea or emesis and denies any change in bowel or bladder habits.   A detailed review of systems is otherwise stable and noncontributory.    PAST MEDICAL HISTORY: Past Medical History  Diagnosis Date  . Cancer     breast  . Psoriasis   . Legally blind     macular degeneration  . Hypertension   . CAD (coronary artery disease) 2007    Last cath 2007, Last echo-09/01/10 EF >55%, RV Systolic pressure30-40 mmHg;Last Nuc 2006 no ischemia  . Pulmonary HTN     mild  . Seizure disorder        . H/O syncope     remotely, no etiology found, mild carotid disease on doppler, 0-49% bil    PAST SURGICAL HISTORY: Past Surgical History  Procedure Laterality Date  . Cholecystectomy    . Spine surgery    . Appendectomy    . Tonsillectomy    . Cardiac catheterization  11/22/2005    RCA with 20-30% eccentric narrowing other vessels free of disease    FAMILY HISTORY Family History  Problem Relation Age of Onset  . Pulmonary embolism Mother 34    caused her death  . CAD Father 67    caused his death  . Stroke Father   . Leukemia Brother 42  cause of death  . CAD Brother   . CAD Brother    The patient's father died in his 37s from a myocardial infarction. Her mother also died in her 80s from the same cause. The patient had 2 brothers and 6 sisters. There is no history of breast or ovarian cancer in the immediate family.   GYNECOLOGIC HISTORY: The patient is GX P1. First live birth age 79. She does not recall when she went through the change of life. She never took hormone replacement.   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 (She went by "[name]"). She is divorced, and lives in a retirement community, independent living section. Her son, [name], is a Industrial/product designer judge in [name]. The patient has 2 grandchildren, no greatgrandchildren. She is a International aid/development worker.   ADVANCED DIRECTIVES: The patient's son [name] is her healthcare power of attorney.  HEALTH MAINTENANCE: History  Substance Use Topics  . Smoking status: Former Smoker    Quit date: 11/20/1994  . Smokeless tobacco: Former Neurosurgeon    Quit date: 11/20/1994  . Alcohol Use: No     Colonoscopy:  PAP:  Bone density:  Lipid panel:  Allergies  Allergen Reactions  . Ephedra (Ephedrine)   . Captopril Rash  . Phenobarbital Rash    Current Outpatient Prescriptions  Medication Sig Dispense Refill  . anastrozole (ARIMIDEX) 1 MG tablet       . CHOLECALCIFEROL PO Take 1 tablet by mouth daily.      . Cyanocobalamin (VITAMIN B 12 PO) Take 1 tablet by mouth daily.      Marland Kitchen etodolac (LODINE) 300 MG capsule       . metoprolol (LOPRESSOR) 50 MG tablet Daily.      Marland Kitchen loratadine (CLARITIN) 10 MG tablet Take 10 mg by mouth daily as needed for allergies.      . pravastatin (PRAVACHOL) 10 MG tablet        No current facility-administered medications for this visit.    OBJECTIVE: Elderly white woman who appears well  Filed Vitals:   05/01/13 1055  BP: 158/60  Pulse:   Temp:   Resp:      Body mass index is 20.55 kg/(m^2).    ECOG FS: 1 Filed Weights   05/01/13 1050  Weight: 105 lb 3.2 oz (47.718 kg)   Sclerae unicteric Oropharynx clear No cervical or supraclavicular adenopathy Lungs clear to auscultation, no rales or rhonchi Heart regular rate and rhythm Abd soft, thin, nontender, positive bowel sounds MSK no focal spinal tenderness, kyphosis and scoliosis noted on exam  No peripheral edema Neuro: nonfocal, well oriented with positive affect Breasts: There is an easily palpated mass in the lateral edge of the  right breast, freely movable, measuring approximately 1.0 cm in diameter. Left breast is unremarkable. Axillae are benign bilaterally with no palpable adenopathy noted.   LAB RESULTS: Lab Results  Component Value Date   WBC 6.8 04/24/2013   NEUTROABS 4.5 04/24/2013   HGB 13.4 04/24/2013   HCT 41.1 04/24/2013   MCV 91.9 04/24/2013   PLT 250 04/24/2013      Chemistry      Component Value Date/Time   NA 141 04/24/2013 0930   NA 130* 07/07/2011 1353   K 4.2 04/24/2013 0930   K 3.4* 07/07/2011 1353   CL 106 04/24/2013 0930   CL 94* 07/07/2011 1353   CO2 28 04/24/2013 0930   CO2 31 07/07/2011  1353   BUN 12.8 04/24/2013 0930   BUN 8 07/07/2011 1353   CREATININE 0.8 04/24/2013 0930   CREATININE <0.47* 07/07/2011 1353      Component Value Date/Time   CALCIUM 8.4 04/24/2013 0930   CALCIUM 8.3* 07/07/2011 1353   ALKPHOS 45 04/24/2013 0930   ALKPHOS 63 07/07/2011 1353   AST 19 04/24/2013 0930   AST 16 07/07/2011 1353   ALT 11 04/24/2013 0930   ALT 17 07/07/2011 1353   BILITOT 1.16 04/24/2013 0930   BILITOT 0.4 07/07/2011 1353       Lab Results  Component Value Date   LABCA2 20 07/19/2012    STUDIES:   Mm Digital Diagnostic Unilat R  04/24/2013   *RADIOLOGY REPORT*  Clinical Data:  Patient presents for diagnostic right breast mammogram and ultrasound evaluation to evaluate response to neoadjuvant therapy for known malignancy in the right breast.  DIGITAL DIAGNOSTIC RIGHT MAMMOGRAM WITH CAD AND RIGHT BREAST ULTRASOUND:  Comparison:  11/01/2012 and 06/27/2012  Findings:  ACR Breast Density Category 2: There is a scattered fibroglandular pattern.  Exam demonstrates a ribbon shaped metallic clip over one of patient's two biopsy-proven malignancies in the right breast over the outer midportion of the right breast.  There is slightly less density in the region of the ribbon shaped metallic clip compared to August 2013 and no significant change compared to December 2013. The second biopsy-proven malignancy deeper in the outer  right breast is off the field of view and not imaged mammographically today.  Mammographic images were processed with CAD.  Ultrasound is performed, showing evidence of patient's biopsy proven malignancy at the 9 o'clock position of the right breast 4 cm from the nipple measuring 0.6 x 1.1 x 1.2 cm significantly smaller compared to August and not significantly changed compared to December 2013.  Patient's second biopsy-proven malignancy is more difficult to identify at the 9 o'clock position 2 cm from the nipple as this measures approximately 4 x 9 x 9 mm which is significantly smaller compared to August and slightly smaller compared to December 2013.  Remainder of the exam is unchanged.  IMPRESSION: Interval positive response to neoadjuvant therapy. Evidence of patient's two biopsy-proven malignancies over the 9 o'clock position of the right breast as the mass 4 cm from the nipple is significantly smaller compared to August 8 and not significantly changed compared to December 2013.  The mass of 2 cm from the nipple is also significantly smaller compared to August 2013 and also slightly smaller compared to December 2013.  RECOMMENDATION: Recommend continued follow-up per clinical treatment plan.  I have discussed the findings and recommendations with the patient. Results were also provided in writing at the conclusion of the visit.  If applicable, a reminder letter will be sent to the patient regarding the next appointment.  BI-RADS CATEGORY 6:  Known biopsy-proven malignancy - appropriate action should be taken.   Original Report Authenticated By: Elberta Fortis, M.D.      ASSESSMENT: 79 y.o. [place] woman   (1)  status post Right breast biopsy 06/27/2012 for a ductal carcinoma in situ, with possible microinvasion, strongly estrogen and progesterone receptor positive.  (2)  Declined surgery, treated with anastrazole since September 2013  (3) osteoporosis documented by bone density December 2013;   receiving zoledronic acid, first dose given in January 2014, to be given yearly   PLAN:  [name] continues to have a good response on the anastrozole which she is tolerating well. She will continue as before,  and return to see [place] in 3 months. She'll be due for her next bilateral mammogram in August, and we will also obtain a right breast ultrasound at that time for further evaluation of response to therapy.   She voices understanding and agreement with this plan, and will call with any changes or problems.  Of note, she will continue to receive zoledronic acid on an annual basis, not due again until January 2015.   [name]    05/01/2013

## 2013-07-04 ENCOUNTER — Ambulatory Visit
Admission: RE | Admit: 2013-07-04 | Discharge: 2013-07-04 | Disposition: A | Discharge status: Home / Self Care | Payer: Medicare Other | Source: Ambulatory Visit | Attending: Physician Assistant | Admitting: Physician Assistant

## 2013-07-04 DIAGNOSIS — C50411 Malignant neoplasm of upper-outer quadrant of right female breast: Secondary | ICD-10-CM

## 2013-07-11 ENCOUNTER — Other Ambulatory Visit: Payer: Self-pay | Admitting: Orthopedic Surgery

## 2013-07-23 ENCOUNTER — Encounter (HOSPITAL_BASED_OUTPATIENT_CLINIC_OR_DEPARTMENT_OTHER): Payer: Self-pay | Admitting: *Deleted

## 2013-07-23 NOTE — Progress Notes (Signed)
Caregiver will bring her for [id number] lives independent living-legally blind but can do most of care.

## 2013-07-29 ENCOUNTER — Encounter (HOSPITAL_BASED_OUTPATIENT_CLINIC_OR_DEPARTMENT_OTHER): Payer: Self-pay | Admitting: Anesthesiology

## 2013-07-29 ENCOUNTER — Encounter (HOSPITAL_BASED_OUTPATIENT_CLINIC_OR_DEPARTMENT_OTHER): Payer: Self-pay | Admitting: Orthopedic Surgery

## 2013-07-29 ENCOUNTER — Other Ambulatory Visit: Payer: Self-pay | Admitting: Oncology

## 2013-07-29 ENCOUNTER — Ambulatory Visit (HOSPITAL_BASED_OUTPATIENT_CLINIC_OR_DEPARTMENT_OTHER)
Admission: RE | Admit: 2013-07-29 | Discharge: 2013-07-29 | Disposition: A | Discharge status: Home / Self Care | Payer: Medicare Other | Source: Ambulatory Visit | Attending: Orthopedic Surgery | Admitting: Orthopedic Surgery

## 2013-07-29 ENCOUNTER — Ambulatory Visit (HOSPITAL_BASED_OUTPATIENT_CLINIC_OR_DEPARTMENT_OTHER): Payer: Medicare Other | Admitting: Anesthesiology

## 2013-07-29 ENCOUNTER — Encounter (HOSPITAL_BASED_OUTPATIENT_CLINIC_OR_DEPARTMENT_OTHER)
Admission: RE | Disposition: A | Discharge status: Home / Self Care | Payer: Self-pay | Source: Ambulatory Visit | Attending: Orthopedic Surgery

## 2013-07-29 DIAGNOSIS — M810 Age-related osteoporosis without current pathological fracture: Secondary | ICD-10-CM | POA: Insufficient documentation

## 2013-07-29 DIAGNOSIS — H548 Legal blindness, as defined in USA: Secondary | ICD-10-CM | POA: Insufficient documentation

## 2013-07-29 DIAGNOSIS — I2510 Atherosclerotic heart disease of native coronary artery without angina pectoris: Secondary | ICD-10-CM | POA: Insufficient documentation

## 2013-07-29 DIAGNOSIS — Z79899 Other long term (current) drug therapy: Secondary | ICD-10-CM | POA: Insufficient documentation

## 2013-07-29 DIAGNOSIS — Z853 Personal history of malignant neoplasm of breast: Secondary | ICD-10-CM | POA: Insufficient documentation

## 2013-07-29 DIAGNOSIS — F0390 Unspecified dementia without behavioral disturbance: Secondary | ICD-10-CM | POA: Insufficient documentation

## 2013-07-29 DIAGNOSIS — C50411 Malignant neoplasm of upper-outer quadrant of right female breast: Secondary | ICD-10-CM

## 2013-07-29 DIAGNOSIS — Z888 Allergy status to other drugs, medicaments and biological substances status: Secondary | ICD-10-CM | POA: Insufficient documentation

## 2013-07-29 DIAGNOSIS — Z87891 Personal history of nicotine dependence: Secondary | ICD-10-CM | POA: Insufficient documentation

## 2013-07-29 DIAGNOSIS — Z8249 Family history of ischemic heart disease and other diseases of the circulatory system: Secondary | ICD-10-CM | POA: Insufficient documentation

## 2013-07-29 DIAGNOSIS — G40909 Epilepsy, unspecified, not intractable, without status epilepticus: Secondary | ICD-10-CM | POA: Insufficient documentation

## 2013-07-29 DIAGNOSIS — I10 Essential (primary) hypertension: Secondary | ICD-10-CM | POA: Insufficient documentation

## 2013-07-29 DIAGNOSIS — G5600 Carpal tunnel syndrome, unspecified upper limb: Secondary | ICD-10-CM | POA: Insufficient documentation

## 2013-07-29 HISTORY — DX: Other seasonal allergic rhinitis: J30.2

## 2013-07-29 HISTORY — DX: Other problems related to housing and economic circumstances: Z59.8

## 2013-07-29 HISTORY — PX: CARPAL TUNNEL RELEASE: SHX101

## 2013-07-29 HISTORY — DX: Presence of dental prosthetic device (complete) (partial): Z97.2

## 2013-07-29 HISTORY — DX: Reserved for inherently not codable concepts without codable children: IMO0001

## 2013-07-29 HISTORY — DX: Presence of dental prosthetic device (complete) (partial): K08.109

## 2013-07-29 HISTORY — DX: Other amnesia: R41.3

## 2013-07-29 HISTORY — DX: Presence of spectacles and contact lenses: Z97.3

## 2013-07-29 SURGERY — CARPAL TUNNEL RELEASE
Anesthesia: Regional | Site: Wrist | Laterality: Right

## 2013-07-29 MED ORDER — OXYCODONE HCL 5 MG PO TABS: 5.0000 mg | ORAL_TABLET | Freq: Once | ORAL | Status: DC | PRN

## 2013-07-29 MED ORDER — LIDOCAINE HCL (PF) 0.5 % IJ SOLN
INTRAMUSCULAR | Status: DC | PRN
  Administered 2013-07-29: 25 mL via INTRAVENOUS

## 2013-07-29 MED ORDER — OXYCODONE HCL 5 MG/5ML PO SOLN: 5.0000 mg | Freq: Once | ORAL | Status: DC | PRN

## 2013-07-29 MED ORDER — MEPERIDINE HCL 25 MG/ML IJ SOLN: 6.2500 mg | INTRAMUSCULAR | Status: DC | PRN

## 2013-07-29 MED ORDER — HYDROMORPHONE HCL PF 1 MG/ML IJ SOLN
0.2500 mg | INTRAMUSCULAR | Status: DC | PRN
  Administered 2013-07-29 (×2): 0.5 mg via INTRAVENOUS

## 2013-07-29 MED ORDER — PROPOFOL 10 MG/ML IV BOLUS
INTRAVENOUS | Status: DC | PRN
  Administered 2013-07-29: 10 mg via INTRAVENOUS

## 2013-07-29 MED ORDER — CHLORHEXIDINE GLUCONATE 4 % EX LIQD: 60.0000 mL | Freq: Once | CUTANEOUS | Status: DC

## 2013-07-29 MED ORDER — CEFAZOLIN SODIUM-DEXTROSE 2-3 GM-% IV SOLR
2.0000 g | INTRAVENOUS | Status: AC
  Administered 2013-07-29: 2 g via INTRAVENOUS

## 2013-07-29 MED ORDER — MIDAZOLAM HCL 2 MG/2ML IJ SOLN: 1.0000 mg | INTRAMUSCULAR | Status: DC | PRN

## 2013-07-29 MED ORDER — ONDANSETRON HCL 4 MG/2ML IJ SOLN: 4.0000 mg | Freq: Once | INTRAMUSCULAR | Status: DC | PRN

## 2013-07-29 MED ORDER — FENTANYL CITRATE 0.05 MG/ML IJ SOLN
INTRAMUSCULAR | Status: DC | PRN
  Administered 2013-07-29: 25 ug via INTRAVENOUS

## 2013-07-29 MED ORDER — FENTANYL CITRATE 0.05 MG/ML IJ SOLN: 50.0000 ug | INTRAMUSCULAR | Status: DC | PRN

## 2013-07-29 MED ORDER — BUPIVACAINE HCL (PF) 0.25 % IJ SOLN
INTRAMUSCULAR | Status: DC | PRN
  Administered 2013-07-29: 5 mL

## 2013-07-29 MED ORDER — HYDROCODONE-ACETAMINOPHEN 5-325 MG PO TABS: 1.0000 | ORAL_TABLET | Freq: Four times a day (QID) | ORAL | Status: DC | PRN

## 2013-07-29 MED ORDER — LACTATED RINGERS IV SOLN
INTRAVENOUS | Status: DC
  Administered 2013-07-29: 09:00:00 via INTRAVENOUS

## 2013-07-29 MED ORDER — PROPOFOL INFUSION 10 MG/ML OPTIME
INTRAVENOUS | Status: DC | PRN
  Administered 2013-07-29: 50 ug/kg/min via INTRAVENOUS

## 2013-07-29 SURGICAL SUPPLY — 41 items
BANDAGE GAUZE ELAST BULKY 4 IN (GAUZE/BANDAGES/DRESSINGS) ×2 IMPLANT
BLADE SURG 15 STRL LF DISP TIS (BLADE) ×1 IMPLANT
BLADE SURG 15 STRL SS (BLADE) ×2
BNDG CMPR 9X4 STRL LF SNTH (GAUZE/BANDAGES/DRESSINGS)
BNDG COHESIVE 3X5 TAN STRL LF (GAUZE/BANDAGES/DRESSINGS) ×2 IMPLANT
BNDG ESMARK 4X9 LF (GAUZE/BANDAGES/DRESSINGS) IMPLANT
CHLORAPREP W/TINT 26ML (MISCELLANEOUS) ×2 IMPLANT
CLOTH BEACON ORANGE TIMEOUT ST (SAFETY) ×2 IMPLANT
CORDS BIPOLAR (ELECTRODE) ×2 IMPLANT
COVER MAYO STAND STRL (DRAPES) ×2 IMPLANT
COVER TABLE BACK 60X90 (DRAPES) ×2 IMPLANT
CUFF TOURNIQUET SINGLE 18IN (TOURNIQUET CUFF) ×2 IMPLANT
DRAPE EXTREMITY T 121X128X90 (DRAPE) ×2 IMPLANT
DRAPE SURG 17X23 STRL (DRAPES) ×2 IMPLANT
DRSG KUZMA FLUFF (GAUZE/BANDAGES/DRESSINGS) ×2 IMPLANT
GAUZE XEROFORM 1X8 LF (GAUZE/BANDAGES/DRESSINGS) ×2 IMPLANT
GLOVE BIO SURGEON STRL SZ 6.5 (GLOVE) ×1 IMPLANT
GLOVE BIO SURGEON STRL SZ7.5 (GLOVE) ×1 IMPLANT
GLOVE BIOGEL PI IND STRL 8 (GLOVE) IMPLANT
GLOVE BIOGEL PI IND STRL 8.5 (GLOVE) ×1 IMPLANT
GLOVE BIOGEL PI INDICATOR 8 (GLOVE) ×1
GLOVE BIOGEL PI INDICATOR 8.5 (GLOVE) ×1
GLOVE EXAM NITRILE EXT CUFF MD (GLOVE) ×1 IMPLANT
GLOVE SURG ORTHO 8.0 STRL STRW (GLOVE) ×2 IMPLANT
GOWN BRE IMP PREV XXLGXLNG (GOWN DISPOSABLE) ×3 IMPLANT
GOWN PREVENTION PLUS XLARGE (GOWN DISPOSABLE) ×2 IMPLANT
NEEDLE 27GAX1X1/2 (NEEDLE) ×1 IMPLANT
NS IRRIG 1000ML POUR BTL (IV SOLUTION) ×2 IMPLANT
PACK BASIN DAY SURGERY FS (CUSTOM PROCEDURE TRAY) ×2 IMPLANT
PAD CAST 3X4 CTTN HI CHSV (CAST SUPPLIES) ×1 IMPLANT
PADDING CAST ABS 4INX4YD NS (CAST SUPPLIES) ×1
PADDING CAST ABS COTTON 4X4 ST (CAST SUPPLIES) ×1 IMPLANT
PADDING CAST COTTON 3X4 STRL (CAST SUPPLIES) ×2
SPONGE GAUZE 4X4 12PLY (GAUZE/BANDAGES/DRESSINGS) ×2 IMPLANT
STOCKINETTE 4X48 STRL (DRAPES) ×2 IMPLANT
SUT VICRYL 4-0 PS2 18IN ABS (SUTURE) IMPLANT
SUT VICRYL RAPIDE 4/0 PS 2 (SUTURE) ×2 IMPLANT
SYR BULB 3OZ (MISCELLANEOUS) ×2 IMPLANT
SYR CONTROL 10ML LL (SYRINGE) ×1 IMPLANT
TOWEL OR 17X24 6PK STRL BLUE (TOWEL DISPOSABLE) ×2 IMPLANT
UNDERPAD 30X30 INCONTINENT (UNDERPADS AND DIAPERS) ×2 IMPLANT

## 2013-07-29 NOTE — Anesthesia Preprocedure Evaluation (Signed)
Anesthesia Evaluation  Patient identified by MRN, date of birth, ID band Patient awake    Reviewed: Allergy & Precautions, H&P , NPO status , Patient's Chart, lab work & pertinent test results  Airway Mallampati: I TM Distance: >3 FB Neck ROM: Full    Dental   Pulmonary          Cardiovascular hypertension, Pt. on medications + CAD     Neuro/Psych    GI/Hepatic   Endo/Other    Renal/GU      Musculoskeletal   Abdominal   Peds  Hematology   Anesthesia Other Findings   Reproductive/Obstetrics                           Anesthesia Physical Anesthesia Plan  ASA: III  Anesthesia Plan: Bier Block   Post-op Pain Management:    Induction: Intravenous  Airway Management Planned: Simple Face Mask  Additional Equipment:   Intra-op Plan:   Post-operative Plan:   Informed Consent: I have reviewed the patients History and Physical, chart, labs and discussed the procedure including the risks, benefits and alternatives for the proposed anesthesia with the patient or authorized representative who has indicated his/her understanding and acceptance.     Plan Discussed with: CRNA and Surgeon  Anesthesia Plan Comments:         Anesthesia Quick Evaluation

## 2013-07-29 NOTE — Brief Op Note (Signed)
07/29/2013  10:17 AM  PATIENT:  Melina Copa  79 y.o. female  PRE-OPERATIVE DIAGNOSIS:  RIGHT CARPAL TUNNEL SYNDROME  POST-OPERATIVE DIAGNOSIS:  Right carpal tunnel Syndrome  PROCEDURE:  Procedure(s): CARPAL TUNNEL RELEASE (Right)  SURGEON:  Surgeon(s) and Role:    * Nicki Reaper, MD - Primary  PHYSICIAN ASSISTANT:   ASSISTANTS: K Sherrelle Prochazka,MD   ANESTHESIA:   local and regional  EBL:  Total I/O In: 500 [I.V.:500] Out: -   BLOOD ADMINISTERED:none  DRAINS: none   LOCAL MEDICATIONS USED:  MARCAINE     SPECIMEN:  No Specimen  DISPOSITION OF SPECIMEN:  N/A  COUNTS:  YES  TOURNIQUET:   Total Tourniquet Time Documented: Forearm (Right) - 18 minutes Total: Forearm (Right) - 18 minutes   DICTATION: .Other Dictation: Dictation Number [id number]  PLAN OF CARE: Discharge to home after PACU  PATIENT DISPOSITION:  PACU - hemodynamically stable.

## 2013-07-29 NOTE — Anesthesia Postprocedure Evaluation (Signed)
Anesthesia Post Note  Patient: [name]  Procedure(s) Performed: Procedure(s) (LRB): CARPAL TUNNEL RELEASE (Right)  Anesthesia type: MAC  Patient location: PACU  Post pain: Pain level controlled  Post assessment: Patient's Cardiovascular Status Stable  Last Vitals:  Filed Vitals:   07/29/13 1145  BP: 149/75  Pulse: 84  Temp: 36.7 C  Resp: 18    Post vital signs: Reviewed and stable  Level of consciousness: sedated  Complications: No apparent anesthesia complications

## 2013-07-29 NOTE — Op Note (Signed)
Dictation Number [id number]

## 2013-07-29 NOTE — H&P (Signed)
[name] is an 79 year old right hand dominant female with bilateral hand pain, numbness right greater than left that has been going on for several years. She states her fingers are numb almost all the time. She is complaining of pain at the tip. This is throbbing and aching in nature with a feeling of weakness. She says it is getting worse. She was given Etodolac. She has no history of injury to the hand that she can recall.  No history of diabetes, thyroid problems, arthritis or gout. She does not know her family history to any significance. She has had her nerve conductions done by Dr. Johna Roles. This reveals a motor delay of 5.8 on the left, 7.1 on the right, sensory delay of 3.2 on the left and 5.0 on the right with amplitude diminution to 12 and 11 left as compared to right.   PAST MEDICAL HISTORY: She is allergic to Phenobarbital, Captopril and Diltiazem. She is on the following medications: Lorazepam, vitamin D, Anastrozole, vitamin B12, Eucerin/Triamcinolone cream, Amoxicillin, Benadryl, Etodolac and Metoprolol. She has a history of hypertension, breast cancer, osteoporosis. Past surgery includes cholecystectomy but states she cannot remember any others.   FAMILY H ISTORY: Positive for heart disease and high BP.  SOCIAL HISTORY: She does not smoke or drink. She is divorced and a retired Geologist, engineering.   REVIEW OF SYSTEMS: Positive for cancer in 2013, glasses and dementia. [name] is an 79 y.o. female.   Chief Complaint: CTS Rt HPI: see above  Past Medical History  Diagnosis Date  . Cancer     breast  . Psoriasis   . Legally blind     macular degeneration  . Hypertension   . CAD (coronary artery disease) 2007    Last cath 2007, Last echo-09/01/10 EF >55%, RV Systolic pressure30-40 mmHg;Last Nuc 2006 no ischemia  . Pulmonary HTN     mild  . Seizure disorder        . H/O syncope     remotely, no etiology found, mild carotid disease on doppler, 0-49% bil  . Full  dentures   . Wears glasses   . Lives in independent group home     independent living [facility]-  . Memory deficit   . Seasonal allergies     Past Surgical History  Procedure Laterality Date  . Cholecystectomy    . Spine surgery    . Appendectomy    . Tonsillectomy    . Cardiac catheterization  11/22/2005    RCA with 20-30% eccentric narrowing other vessels free of disease    Family History  Problem Relation Age of Onset  . Pulmonary embolism Mother 74    caused her death  . CAD Father 55    caused his death  . Stroke Father   . Leukemia Brother 60    cause of death  . CAD Brother   . CAD Brother    Social History:  reports that she quit smoking about 18 years ago. She quit smokeless tobacco use about 18 years ago. She reports that she does not drink alcohol or use illicit drugs.  Allergies:  Allergies  Allergen Reactions  . Ephedra [Ephedrine]   . Captopril Rash  . Phenobarbital Rash    No prescriptions prior to admission    No results found for this or any previous visit (from the past 48 hour(s)).  No results found.   Pertinent items are noted in HPI.  Height 5' (1.524 m), weight 105 lb (47.628  kg).  General appearance: alert, cooperative and appears stated age Head: Normocephalic, without obvious abnormality Neck: no JVD Resp: clear to auscultation bilaterally Cardio: regular rate and rhythm, S1, S2 normal, no murmur, click, rub or gallop GI: soft, non-tender; bowel sounds normal; no masses,  no organomegaly Extremities: extremities normal, atraumatic, no cyanosis or edema Pulses: 2+ and symmetric Skin: Skin color, texture, turgor normal. No rashes or lesions Neurologic: Grossly normal Incision/Wound: na  Assessment/Plan We have discussed the possibility of surgical intervention for her carpal tunnel syndrome. The pre, peri and post op course are discussed along with risks and complications.  She is aware there is no guarantee with surgery,  possibility of infection, recurrence, injury to arteries, nerves and tendons, incomplete relief of symptoms and dystrophy. She is aware there may be the possibility there may be a double crush. She would like to proceed. She is scheduled for right carpal tunnel release as an outpatient under regional anesthesia.  Thong Feeny R 07/29/2013, 7:45 AM

## 2013-07-29 NOTE — Transfer of Care (Signed)
Immediate Anesthesia Transfer of Care Note  Patient: [name]  Procedure(s) Performed: Procedure(s): CARPAL TUNNEL RELEASE (Right)  Patient Location: PACU  Anesthesia Type:MAC and Bier block  Level of Consciousness: awake, alert  and oriented  Airway & Oxygen Therapy: Patient Spontanous Breathing  Post-op Assessment: Report given to PACU RN and Post -op Vital signs reviewed and stable  Post vital signs: Reviewed and stable  Complications: No apparent anesthesia complications

## 2013-07-30 ENCOUNTER — Encounter (HOSPITAL_BASED_OUTPATIENT_CLINIC_OR_DEPARTMENT_OTHER): Payer: Self-pay | Admitting: Orthopedic Surgery

## 2013-07-30 NOTE — Op Note (Signed)
NAMELANIQUE, GONZALO               ACCOUNT NO.:  [id number]  MEDICAL RECORD NO.:  [id number]  LOCATION:                                 FACILITY:  PHYSICIAN:  Cindee Salt, M.D.            DATE OF BIRTH:  DATE OF PROCEDURE:  07/29/2013 DATE OF DISCHARGE:                              OPERATIVE REPORT   PREOPERATIVE DIAGNOSIS:  Carpal tunnel syndrome, right hand.  POSTOPERATIVE DIAGNOSIS:  Carpal tunnel syndrome, right hand.  OPERATION:  Decompression, right median nerve.  SURGEON:  Cindee Salt, M.D.  ASSISTANT:  Betha Loa, MD  ANESTHESIA:  Forearm-based IV regional with local infiltration.  ANESTHESIOLOGIST:  Kaylyn Layer. Michelle Piper, M.D.  HISTORY:  The patient is an 78 year old female with a history of bilateral carpal tunnel syndrome.  Nerve conduction is positive.  She has elected to undergo surgical release.  Pre, peri, and postoperative course have been discussed along with risks and complications.  She is aware that there is no guarantee with the surgery; possibility of infection; recurrence of injury to arteries, nerves, tendons; incomplete relief of symptoms, dystrophy.  In the preoperative area, the patient was seen, the extremity marked by both the patient and surgeon, and antibiotic given.  PROCEDURE IN DETAIL:  The patient was brought to the operating room where a forearm-based IV regional anesthetic was carried out without difficulty to the right arm.  She was prepped using ChloraPrep, supine position, right arm free.  A 3-minute dry time was allowed.  Time-out taken, confirming the patient and procedure.  A longitudinal incision was made in the right palm, carried down through the subcutaneous tissue.  Bleeders were electrocauterized.  Palmar fascia was split. Superficial palmar arch identified.  Flexor tendon of the ring and little finger identified to the ulnar side of the median nerve.  The carpal retinaculum was incised with sharp dissection.  Right angle  and Sewall retractor were placed between skin and forearm fascia.  Fascia was released for approximately 1.5 cm proximal to the wrist crease under direct vision.  The canal was explored.  Motor branch was noted enter into the muscle distally.  No further lesions were identified.  Area compression to the nerve was apparent.  No further lesions were noted. The wound was copiously irrigated with saline, and the skin closed with interrupted 4-0 Vicryl Rapide sutures.  Local infiltration with 0.25% Marcaine without epinephrine was given, approximately 6 mL was used.  Sterile compressive dressing with fingers free was applied.  On deflation of the tourniquet, all fingers immediately pinked.  She was taken to the recovery room for observation in satisfactory condition.  She will be discharged home to return in 1 week on Norco.          ______________________________ Cindee Salt, M.D.     GK/MEDQ  D:  07/29/2013  T:  07/29/2013  Job:  [id number]

## 2013-08-11 ENCOUNTER — Other Ambulatory Visit (HOSPITAL_BASED_OUTPATIENT_CLINIC_OR_DEPARTMENT_OTHER): Payer: Medicare Other | Admitting: Lab

## 2013-08-11 DIAGNOSIS — C50419 Malignant neoplasm of upper-outer quadrant of unspecified female breast: Secondary | ICD-10-CM

## 2013-08-11 DIAGNOSIS — C50411 Malignant neoplasm of upper-outer quadrant of right female breast: Secondary | ICD-10-CM

## 2013-08-11 LAB — CBC WITH DIFFERENTIAL/PLATELET
BASO%: 0.7 % (ref 0.0–2.0)
EOS%: 14.4 % — ABNORMAL HIGH (ref 0.0–7.0)
LYMPH%: 10.7 % — ABNORMAL LOW (ref 14.0–49.7)
MCHC: 33.5 g/dL (ref 31.5–36.0)
MCV: 91.2 fL (ref 79.5–101.0)
MONO#: 0.8 10*3/uL (ref 0.1–0.9)
MONO%: 11.3 % (ref 0.0–14.0)
Platelets: 237 10*3/uL (ref 145–400)
RBC: 4.51 10*6/uL (ref 3.70–5.45)
WBC: 7.1 10*3/uL (ref 3.9–10.3)

## 2013-08-11 LAB — COMPREHENSIVE METABOLIC PANEL (CC13)
ALT: 10 U/L (ref 0–55)
AST: 18 U/L (ref 5–34)
Alkaline Phosphatase: 43 U/L (ref 40–150)
Sodium: 140 mEq/L (ref 136–145)
Total Bilirubin: 0.89 mg/dL (ref 0.20–1.20)
Total Protein: 6.4 g/dL (ref 6.4–8.3)

## 2013-08-18 ENCOUNTER — Telehealth: Payer: Self-pay | Admitting: *Deleted

## 2013-08-18 ENCOUNTER — Telehealth: Payer: Self-pay | Admitting: Oncology

## 2013-08-18 ENCOUNTER — Ambulatory Visit (HOSPITAL_BASED_OUTPATIENT_CLINIC_OR_DEPARTMENT_OTHER): Payer: Medicare Other | Admitting: Oncology

## 2013-08-18 VITALS — BP 134/64 | HR 81 | Temp 97.4°F | Resp 20 | Ht 60.0 in | Wt 99.1 lb

## 2013-08-18 DIAGNOSIS — M810 Age-related osteoporosis without current pathological fracture: Secondary | ICD-10-CM

## 2013-08-18 DIAGNOSIS — D0590 Unspecified type of carcinoma in situ of unspecified breast: Secondary | ICD-10-CM

## 2013-08-18 DIAGNOSIS — Z170 Estrogen receptor positive status [ER+]: Secondary | ICD-10-CM

## 2013-08-18 DIAGNOSIS — C50411 Malignant neoplasm of upper-outer quadrant of right female breast: Secondary | ICD-10-CM | POA: Insufficient documentation

## 2013-08-18 NOTE — Telephone Encounter (Signed)
, °

## 2013-08-18 NOTE — Telephone Encounter (Signed)
Per staff message and POF I have scheduled appts.  JMW  

## 2013-08-18 NOTE — Addendum Note (Signed)
Addended by: Billey Co on: 08/18/2013 04:53 PM   Modules accepted: Orders, Medications

## 2013-08-18 NOTE — Progress Notes (Signed)
ID: [name]   DOB: December 20, 1924  MR#: [id number]  WUJ#:[id number]  PCP: Sara Saupe, MD GYN:  SU:  OTHER MD:   HISTORY OF PRESENT ILLNESS: [name] palpated a mass in her right breast and brought this to her primary physician's attention. She was set up for bilateral diagnostic mammography with right ultrasonography at the [facility] Center 06/27/2012. There was a posterior high-density mass in the upper outer quadrant of the right breast which was palpable, mobile, and accompanied by a second mass in the right breast and an axillary mass as well. Ultrasound confirmed an oval heterogeneously hypoechoic mass with circumscribed margins in the 9:00 position of the breast measuring 2.1 cm. A second hypoechoic mass was noted closer to the nipple, measuring 1.8 cm. An enlarged lymph node with normal morphology was noted in the axilla.  Biopsy of the 2 breast masses was obtained the same day, and showed ([id number]) ductal carcinoma in situ, with foci suspicious for invasive carcinoma. There were also detached fragments of tissue. Definitive distinction between in situ and invasive tumor could not be made with this material. Tumors were strongly estrogen and progesterone receptor positive (95-100%). The patient's subsequent history is as detailed below.  INTERVAL HISTORY: "[name]"  returns today accompanied by her friend and caregiver, [name], for followup of [name]'s right breast carcinoma. The interval history is generally unremarkable. She is tolerating the anastrozole with no side effects that she is aware of. I did ask her what she pays for it and she does not know but she will check to make sure she's not being over chart, as has happened to some of my other patients.   REVIEW OF SYSTEMS: She denies any unusual headaches, visual changes, cough, phlegm production or pleurisy. She does have a little bit of sinus symptoms now that the weather has changed. Her psoriasis is well controlled. Her arthritic  discomforts here and there are not more intense or persistent than before. She feels a little bit forgetful. There is no depression or anxiety. A detailed review of systems today was otherwise noncontributory   PAST MEDICAL HISTORY: Past Medical History  Diagnosis Date  . Cancer     breast  . Psoriasis   . Legally blind     macular degeneration  . Hypertension   . CAD (coronary artery disease) 2007    Last cath 2007, Last echo-09/01/10 EF >55%, RV Systolic pressure30-40 mmHg;Last Nuc 2006 no ischemia  . Pulmonary HTN     mild  . Seizure disorder        . H/O syncope     remotely, no etiology found, mild carotid disease on doppler, 0-49% bil  . Full dentures   . Wears glasses   . Lives in independent group home     independent living [facility]-  . Memory deficit   . Seasonal allergies     PAST SURGICAL HISTORY: Past Surgical History  Procedure Laterality Date  . Cholecystectomy    . Spine surgery    . Appendectomy    . Tonsillectomy    . Cardiac catheterization  11/22/2005    RCA with 20-30% eccentric narrowing other vessels free of disease  . Carpal tunnel release Right 07/29/2013    Procedure: CARPAL TUNNEL RELEASE;  Surgeon: Sara Reaper, MD;  Location: [facility];  Service: Orthopedics;  Laterality: Right;    FAMILY HISTORY Family History  Problem Relation Age of Onset  . Pulmonary embolism Mother 60    caused her death  .  CAD Father 24    caused his death  . Stroke Father   . Leukemia Brother 60    cause of death  . CAD Brother   . CAD Brother    The patient's father died in his 24s from a myocardial infarction. Her mother also died in her 41s from the same cause. The patient had 2 brothers and 6 sisters. There is no history of breast or ovarian cancer in the immediate family.   GYNECOLOGIC HISTORY: The patient is GX P1. First live birth age 69. She does not recall when she went through the change of life. She never took hormone replacement.    SOCIAL HISTORY: Ms. 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 (She went by "[name]"). She is divorced, and lives in a retirement community, independent living section. Her son, [name], is a Industrial/product designer judge in [place]. The patient has 2 grandchildren, no greatgrandchildren. She is a International aid/development worker.   ADVANCED DIRECTIVES: The patient's son [name] is her healthcare power of attorney.  HEALTH MAINTENANCE: History  Substance Use Topics  . Smoking status: Former Smoker    Quit date: 11/20/1994  . Smokeless tobacco: Former Neurosurgeon    Quit date: 11/20/1994  . Alcohol Use: No     Colonoscopy:  PAP:  Bone density:  Lipid panel:  Allergies  Allergen Reactions  . Ephedra [Ephedrine]   . Captopril Rash  . Phenobarbital Rash    Current Outpatient Prescriptions  Medication Sig Dispense Refill  . anastrozole (ARIMIDEX) 1 MG tablet TAKE 1 TABLET BY MOUTH DAILY  30 tablet  0  . CHOLECALCIFEROL PO Take 1 tablet by mouth daily.      . Cyanocobalamin (VITAMIN B 12 PO) Take 1 tablet by mouth daily.      . diphenhydrAMINE (BENADRYL) 25 MG tablet Take 25 mg by mouth 2 (two) times daily as needed for itching.      . etodolac (LODINE) 300 MG capsule       . HYDROcodone-acetaminophen (NORCO) 5-325 MG per tablet Take 1 tablet by mouth every 6 (six) hours as needed for pain.  30 tablet  0  . loratadine (CLARITIN) 10 MG tablet Take 10 mg by mouth daily as needed for allergies.      . metoprolol (LOPRESSOR) 50 MG tablet Daily.       No current facility-administered medications for this visit.    OBJECTIVE: Elderly white woman in no acute distress  Filed Vitals:   08/18/13 1206  BP: 134/64  Pulse: 81  Temp: 97.4 F (36.3 C)  Resp: 20     Body mass index is 19.35 kg/(m^2).    ECOG FS: 1 Filed Weights   08/18/13 1206  Weight: 99 lb 1.6 oz (44.951 kg)   Sclerae unicteric, pupils equal and reactive to light  Oropharynx clear, dentures in place  No  cervical or supraclavicular adenopathy Lungs clear to auscultation, no rales or rhonchi Heart regular rate and rhythm, no murmur appreciated  Abd soft, thin, nontender, positive bowel sounds MSK kyphosis and scoliosis  but no focal spinal tenderness  No peripheral edema Neuro: nonfocal, well oriented, positive affect Breasts: There is an easily palpated  hard movable mass in the  lower outer quadrant of the right breast, measuring approximately 1.0 cm in diameter.There are no skin or nipple changes. The right axilla is benign.  Left breast is unremarkable.   LAB RESULTS: Lab Results  Component Value Date  WBC 7.1 08/11/2013   NEUTROABS 4.5 08/11/2013   HGB 13.8 08/11/2013   HCT 41.1 08/11/2013   MCV 91.2 08/11/2013   PLT 237 08/11/2013      Chemistry      Component Value Date/Time   NA 140 08/11/2013 1456   NA 130* 07/07/2011 1353   K 4.2 08/11/2013 1456   K 3.4* 07/07/2011 1353   CL 106 04/24/2013 0930   CL 94* 07/07/2011 1353   CO2 29 08/11/2013 1456   CO2 31 07/07/2011 1353   BUN 13.7 08/11/2013 1456   BUN 8 07/07/2011 1353   CREATININE 0.9 08/11/2013 1456   CREATININE <0.47* 07/07/2011 1353      Component Value Date/Time   CALCIUM 9.2 08/11/2013 1456   CALCIUM 8.3* 07/07/2011 1353   ALKPHOS 43 08/11/2013 1456   ALKPHOS 63 07/07/2011 1353   AST 18 08/11/2013 1456   AST 16 07/07/2011 1353   ALT 10 08/11/2013 1456   ALT 17 07/07/2011 1353   BILITOT 0.89 08/11/2013 1456   BILITOT 0.4 07/07/2011 1353       Lab Results  Component Value Date   LABCA2 20 07/19/2012    STUDIES: DIGITAL DIAGNOSTIC BILATERAL MAMMOGRAM WITH CAD 07/04/2013 Comparison: 04/24/2013 and earlier  Findings:  ACR Breast Density Category b: There are scattered areas of  fibroglandular density.  Within the posterior aspect of the right breast, there is a small  oval mass, associated with a coil shaped clip, consistent with  known malignancy. Mass measures approximately 11 mm  mammographically. A small ribbon shaped  clip is also identified in  the central aspect of the right breast. No new mass, distortion,  or microcalcifications are identified in either breast. A mole  projects over the lower central portion of the right breast,  confirmed on physical exam.  Mammographic images were processed with CAD.  IMPRESSION:  Smaller known right breast cancer. No new abnormalities  identified.    ASSESSMENT: 79 y.o. [place] woman   (1)  status post Right breast biopsy 06/27/2012 for a ductal carcinoma in situ, with possible microinvasion, strongly estrogen and progesterone receptor positive.  (2)  Declined surgery, treated with anastrazole since September 2013  (3) osteoporosis documented by bone density December 2013;  receiving zoledronic acid, first dose given in January 2014, to be given yearly   PLAN:  Lashondra is tolerating the anastrozole well and it is working quite well in terms of shrinking her cancer. It remains palpable. She understands it would be fairly easy to remove this cancer under local anesthesia. At this point though she just wants to continue the anastrozole and close followup.  Accordingly I have scheduled her for a right mammogram 12/04/2010, which [birth year]. I will see her a week later. We will review her situation and then decide whether we will continue mammography every 6 months or proceed to surgery. She will also receive zoledronic acid that visit.  She knows to call for any problems that may develop before then.   MAGRINAT,GUSTAV C    08/18/2013

## 2013-08-19 ENCOUNTER — Telehealth: Payer: Self-pay | Admitting: Oncology

## 2013-08-19 NOTE — Telephone Encounter (Signed)
, °

## 2013-09-11 ENCOUNTER — Other Ambulatory Visit: Payer: Self-pay | Admitting: *Deleted

## 2013-09-11 DIAGNOSIS — C50411 Malignant neoplasm of upper-outer quadrant of right female breast: Secondary | ICD-10-CM

## 2013-09-11 MED ORDER — ANASTROZOLE 1 MG PO TABS: ORAL_TABLET | ORAL | Status: DC

## 2013-11-25 ENCOUNTER — Other Ambulatory Visit: Payer: Self-pay | Admitting: Family Medicine

## 2013-11-25 DIAGNOSIS — M810 Age-related osteoporosis without current pathological fracture: Secondary | ICD-10-CM

## 2013-12-05 ENCOUNTER — Ambulatory Visit
Admission: RE | Admit: 2013-12-05 | Discharge: 2013-12-05 | Disposition: A | Discharge status: Home / Self Care | Payer: Medicare Other | Source: Ambulatory Visit | Attending: Oncology | Admitting: Oncology

## 2013-12-05 DIAGNOSIS — C50411 Malignant neoplasm of upper-outer quadrant of right female breast: Secondary | ICD-10-CM

## 2013-12-08 ENCOUNTER — Encounter (INDEPENDENT_AMBULATORY_CARE_PROVIDER_SITE_OTHER): Payer: Self-pay

## 2013-12-08 ENCOUNTER — Encounter: Payer: Self-pay | Admitting: Neurology

## 2013-12-08 ENCOUNTER — Ambulatory Visit (INDEPENDENT_AMBULATORY_CARE_PROVIDER_SITE_OTHER): Payer: Medicare Other | Admitting: Neurology

## 2013-12-08 VITALS — BP 154/78 | HR 72 | Ht 60.0 in | Wt 100.0 lb

## 2013-12-08 DIAGNOSIS — R413 Other amnesia: Secondary | ICD-10-CM

## 2013-12-08 DIAGNOSIS — R441 Visual hallucinations: Secondary | ICD-10-CM

## 2013-12-08 DIAGNOSIS — H5316 Psychophysical visual disturbances: Secondary | ICD-10-CM

## 2013-12-08 MED ORDER — DONEPEZIL HCL 10 MG PO TABS: 10.0000 mg | ORAL_TABLET | Freq: Every day | ORAL | Status: DC

## 2013-12-08 NOTE — Progress Notes (Signed)
[facility]  PATIENT: [name] DOB: [birth year]  HISTORICAL   Ms. Launer is 80 year old female, accompanied by her caregiver [name], referred by her primary care physician Dr. Chapman Fitch for evaluation of memory loss, hallucinations  She had past medical history of hypertension, hyperlipidemia, breast cancer, taking Anastrozole.  She was a retired Equities trader, moved to [place] a few years ago, lives alone in her apartment, she has very poor vision, due to macular degeneration,  She enjoys listening to TV, attend some church activities, her friend check on her  regularly  Over the past few months, she has been complaining of a person intruding in her apartment, make himself at home, take away her monies, which is very aggravating for her  During the interview,she could not remember the year she retired, the year she moved to [place]  She has good appetite, sleep well, she has no gait difficulty   REVIEW OF SYSTEMS: Full 14 system review of systems performed and notable only for macular degeneration, poor vision, joint pain, memory loss, confusion, anxiety, hallucinations    ALLERGIES: Allergies  Allergen Reactions  . Ephedra [Ephedrine]   . Captopril Rash  . Phenobarbital Rash    HOME MEDICATIONS: Outpatient Prescriptions Prior to Visit  Medication Sig Dispense Refill  . anastrozole (ARIMIDEX) 1 MG tablet TAKE 1 TABLET BY MOUTH DAILY  30 tablet  3  . CHOLECALCIFEROL PO Take 1 tablet by mouth daily.      . Cyanocobalamin (VITAMIN B 12 PO) Take 1 tablet by mouth daily.      . diphenhydrAMINE (BENADRYL) 25 MG tablet Take 25 mg by mouth 2 (two) times daily as needed for itching.      . loratadine (CLARITIN) 10 MG tablet Take 10 mg by mouth daily as needed for allergies.      . metoprolol (LOPRESSOR) 50 MG tablet Daily.         PAST MEDICAL HISTORY: Past Medical History  Diagnosis Date  . Cancer     breast  . Psoriasis   . Legally blind For  over 10 years    macular degeneration  . Hypertension   . CAD (coronary artery disease) 2007    Last cath 2007, Last echo-09/01/10 EF >50%, RV Systolic [id number] mmHg;Last Nuc 2006 no ischemia  . Pulmonary HTN     mild  . Seizure disorder   . H/O syncope     remotely, no etiology found, mild carotid disease on doppler, 0-49% bil  . Full dentures   . Wears glasses   . Lives in independent group home     independent living [place]-  . Memory deficit   . Seasonal allergies   . Hallucinations     PAST SURGICAL HISTORY: Past Surgical History  Procedure Laterality Date  . Cholecystectomy    . Spine surgery    . Appendectomy    . Tonsillectomy    . Cardiac catheterization  11/22/2005    RCA with 20-30% eccentric narrowing other vessels free of disease  . Carpal tunnel release Right 07/29/2013    Procedure: CARPAL TUNNEL RELEASE;  Surgeon: [name], MD;  Location: [facility];  Service: Orthopedics;  Laterality: Right;  . Gallbladder surgery      FAMILY HISTORY: Family History  Problem Relation Age of Onset  . Pulmonary embolism Mother 29    caused her death  . CAD Father 26    caused his death  . Stroke Father   . Leukemia  Brother 60    cause of death  . CAD Brother   . CAD Brother     SOCIAL HISTORY:  History   Social History  . Marital Status: Divorced    Spouse Name: N/A    Number of Children: 1  . Years of Education: college   Occupational History    Retired - Therapist, sports   Social History Main Topics  . Smoking status: Former Smoker    Types: Cigarettes    Quit date: 11/20/1994  . Smokeless tobacco: Former Systems developer    Quit date: 11/20/1994  . Alcohol Use: No  . Drug Use: No  . Sexual Activity: No   Other Topics Concern  . Not on file   Social History Narrative   Patient lives at home alone. Patient is divorced.   Retired Therapist, sports.   College education.   Right handed.   Caffeine- two cups daily coffee.     PHYSICAL EXAM   Filed Vitals:    12/08/13 1027  BP: 154/78  Pulse: 72  Height: 5' (1.524 m)  Weight: 100 lb (45.36 kg)    Not recorded    Body mass index is 19.53 kg/(m^2).   Generalized: In no acute distress  Neck: Supple, no carotid bruits   Cardiac: Regular rate rhythm  Pulmonary: Clear to auscultation bilaterally  Musculoskeletal: No deformity  Neurological examination  Mentation: Alert oriented to time, place, history taking, and causual conversation, Mini-Mental Status Examination is 16 out of 27, she is not oriented to date, year, place, has difficulty spell words backwards, Mr. 3 out of 3 recalls,   Cranial nerve II-XII: Pupils were equal round reactive to light extraocular movements were full, Visual field were full on confrontational test. Bilateral fundi were sharp.  Facial sensation and strength were normal. Hearing was intact to finger rubbing bilaterally. Uvula tongue midline.  head turning and shoulder shrug and were normal and symmetric.Tongue protrusion into cheek strength was normal.  Motor: normal tone, bulk and strength.  Sensory: Intact to fine touch, pinprick, preserved vibratory sensation, and proprioception at toes.  Coordination: Normal finger to nose, heel-to-shin bilaterally there was no truncal ataxia  Gait: Rising up from seated position without assistance, normal stance, Steady gait   Romberg signs: Negative  Deep tendon reflexes: Brachioradialis 2/2, biceps 2/2, triceps 2/2, patellar 2/2, Achilles 2/2, plantar responses were flexor bilaterally.   DIAGNOSTIC DATA (LABS, IMAGING, TESTING) - I reviewed patient records, labs, notes, testing and imaging myself where available.  Lab Results  Component Value Date   WBC 7.1 08/11/2013   HGB 13.8 08/11/2013   HCT 41.1 08/11/2013   MCV 91.2 08/11/2013   PLT 237 08/11/2013      Component Value Date/Time   NA 140 08/11/2013 1456   NA 130* 07/07/2011 1353   K 4.2 08/11/2013 1456   K 3.4* 07/07/2011 1353   CL 106 04/24/2013 0930    CL 94* 07/07/2011 1353   CO2 29 08/11/2013 1456   CO2 31 07/07/2011 1353   GLUCOSE 128 08/11/2013 1456   GLUCOSE 104* 04/24/2013 0930   GLUCOSE 143* 07/07/2011 1353   BUN 13.7 08/11/2013 1456   BUN 8 07/07/2011 1353   CREATININE 0.9 08/11/2013 1456   CREATININE <0.47* 07/07/2011 1353   CALCIUM 9.2 08/11/2013 1456   CALCIUM 8.3* 07/07/2011 1353   PROT 6.4 08/11/2013 1456   PROT 6.3 07/07/2011 1353   ALBUMIN 3.3* 08/11/2013 1456   ALBUMIN 3.1* 07/07/2011 1353   AST 18 08/11/2013 1456  AST 16 07/07/2011 1353   ALT 10 08/11/2013 1456   ALT 17 07/07/2011 1353   ALKPHOS 43 08/11/2013 1456   ALKPHOS 63 07/07/2011 1353   BILITOT 0.89 08/11/2013 1456   BILITOT 0.4 07/07/2011 1353   GFRNONAA NOT CALCULATED 07/07/2011 1353   GFRAA NOT CALCULATED 07/07/2011 1353   ASSESSMENT AND PLAN   80 years old Caucasian female, with gradual onset of memory trouble, macular degeneration, very poor vision, now has developed visual hallucinations  1 most consistent with her central nervous system degenerative disorder, likely Alzheimer's disease 2. after discussed with patient and her caregiver, we decided not to proceed with more evaluations including imaging, laboratory evaluations, 3 add on donepezil 10 mg every day.   4 return to clinic with Hoyle Sauer in 6 months, if her visual hallucinations become bothersome, to consider low-dose seroquel     Marcial Pacas, M.D. Ph.D.  [facility] [address] [place], [place] [address] [phone]

## 2013-12-11 DIAGNOSIS — R413 Other amnesia: Secondary | ICD-10-CM | POA: Insufficient documentation

## 2013-12-11 DIAGNOSIS — R441 Visual hallucinations: Secondary | ICD-10-CM | POA: Insufficient documentation

## 2013-12-15 ENCOUNTER — Other Ambulatory Visit (HOSPITAL_BASED_OUTPATIENT_CLINIC_OR_DEPARTMENT_OTHER): Payer: Medicare Other

## 2013-12-15 ENCOUNTER — Other Ambulatory Visit: Payer: Self-pay | Admitting: Oncology

## 2013-12-15 ENCOUNTER — Ambulatory Visit (HOSPITAL_BASED_OUTPATIENT_CLINIC_OR_DEPARTMENT_OTHER): Payer: Medicare Other | Admitting: Oncology

## 2013-12-15 ENCOUNTER — Ambulatory Visit: Payer: Medicare Other

## 2013-12-15 ENCOUNTER — Telehealth: Payer: Self-pay | Admitting: *Deleted

## 2013-12-15 ENCOUNTER — Ambulatory Visit
Admission: RE | Admit: 2013-12-15 | Discharge: 2013-12-15 | Disposition: A | Discharge status: Home / Self Care | Payer: Medicare Other | Source: Ambulatory Visit | Attending: Family Medicine | Admitting: Family Medicine

## 2013-12-15 ENCOUNTER — Telehealth: Payer: Self-pay | Admitting: Oncology

## 2013-12-15 VITALS — BP 177/69 | HR 71 | Temp 97.4°F | Resp 18 | Ht 60.0 in | Wt 98.7 lb

## 2013-12-15 DIAGNOSIS — C50419 Malignant neoplasm of upper-outer quadrant of unspecified female breast: Secondary | ICD-10-CM

## 2013-12-15 DIAGNOSIS — C50411 Malignant neoplasm of upper-outer quadrant of right female breast: Secondary | ICD-10-CM

## 2013-12-15 DIAGNOSIS — M8580 Other specified disorders of bone density and structure, unspecified site: Secondary | ICD-10-CM

## 2013-12-15 DIAGNOSIS — M810 Age-related osteoporosis without current pathological fracture: Secondary | ICD-10-CM

## 2013-12-15 DIAGNOSIS — C50919 Malignant neoplasm of unspecified site of unspecified female breast: Secondary | ICD-10-CM

## 2013-12-15 LAB — BASIC METABOLIC PANEL (CC13)
Anion Gap: 8 mEq/L (ref 3–11)
BUN: 9.3 mg/dL (ref 7.0–26.0)
CHLORIDE: 104 meq/L (ref 98–109)
CO2: 30 meq/L — AB (ref 22–29)
CREATININE: 0.8 mg/dL (ref 0.6–1.1)
Calcium: 9.3 mg/dL (ref 8.4–10.4)
Glucose: 120 mg/dl (ref 70–140)
POTASSIUM: 4.3 meq/L (ref 3.5–5.1)
SODIUM: 142 meq/L (ref 136–145)

## 2013-12-15 MED ORDER — SODIUM CHLORIDE 0.9 % IV SOLN
Freq: Once | INTRAVENOUS | Status: AC
  Administered 2013-12-15: 12:00:00 via INTRAVENOUS

## 2013-12-15 MED ORDER — ZOLEDRONIC ACID 4 MG/100ML IV SOLN
4.0000 mg | Freq: Once | INTRAVENOUS | Status: AC
  Administered 2013-12-15: 4 mg via INTRAVENOUS
  Filled 2013-12-15: qty 100

## 2013-12-15 NOTE — Progress Notes (Signed)
ID: [name]   DOB: [birth year]  MR#: [id number]  YTK#:[id number]  PCP: Sara Blackbird, MD GYN:  SU:  OTHER MD:   HISTORY OF PRESENT ILLNESS: [name] palpated a mass in her right breast and brought this to her primary physician's attention. She was set up for bilateral diagnostic mammography with right ultrasonography at the [facility] Center 06/27/2012. There was a posterior high-density mass in the upper outer quadrant of the right breast which was palpable, mobile, and accompanied by a second mass in the right breast and an axillary mass as well. Ultrasound confirmed an oval heterogeneously hypoechoic mass with circumscribed margins in the 9:00 position of the breast measuring 2.1 cm. A second hypoechoic mass was noted closer to the nipple, measuring 1.8 cm. An enlarged lymph node with normal morphology was noted in the axilla.  Biopsy of the 2 breast masses was obtained the same day, and showed ([id number]) ductal carcinoma in situ, with foci suspicious for invasive carcinoma. There were also detached fragments of tissue. Definitive distinction between in situ and invasive tumor could not be made with this material. Tumors were strongly estrogen and progesterone receptor positive (95-100%). The patient's subsequent history is as detailed below.  INTERVAL HISTORY: "[name]"  returns today accompanied by her friend and caregiver, [name], for followup of [name]'s right breast carcinoma. The interval history is generally unremarkable. She is tolerating the anastrozole with no side effects that she is aware of. She just had a birthday and her son came to visit for that occasion, she tells me.  REVIEW OF SYSTEMS: She walks "all over", denies any unusual headaches, visual changes, dizziness, gait imbalance, or falls. She does have macular degeneration. Just a little bit of around the nose. She doesn't like her dentures. Just some psoriasis and some arthritis problems which are not new. She is "a little  forgetful", she tells me. Overall a detailed review of systems today was entirely noncontributory  PAST MEDICAL HISTORY: Past Medical History  Diagnosis Date  . Cancer     breast  . Psoriasis   . Legally blind     macular degeneration  . Hypertension   . CAD (coronary artery disease) 2007    Last cath 2007, Last echo-09/01/10 EF >25%, RV Systolic [id number] mmHg;Last Nuc 2006 no ischemia  . Pulmonary HTN     mild  . Seizure disorder        . H/O syncope     remotely, no etiology found, mild carotid disease on doppler, 0-49% bil  . Full dentures   . Wears glasses   . Lives in independent group home     independent living [place]-  . Memory deficit   . Seasonal allergies   . Hallucinations     PAST SURGICAL HISTORY: Past Surgical History  Procedure Laterality Date  . Cholecystectomy    . Spine surgery    . Appendectomy    . Tonsillectomy    . Cardiac catheterization  11/22/2005    RCA with 20-30% eccentric narrowing other vessels free of disease  . Carpal tunnel release Right 07/29/2013    Procedure: CARPAL TUNNEL RELEASE;  Surgeon: [name], MD;  Location: [facility];  Service: Orthopedics;  Laterality: Right;  . Gallbladder surgery      FAMILY HISTORY Family History  Problem Relation Age of Onset  . Pulmonary embolism Mother 54    caused her death  . CAD Father 47    caused his death  . Stroke  Father   . Leukemia Brother 60    cause of death  . CAD Brother   . CAD Brother    The patient's father died in his 1s from a myocardial infarction. Her mother also died in her 57s from the same cause. The patient had 2 brothers and 6 sisters. There is no history of breast or ovarian cancer in the immediate family.   GYNECOLOGIC HISTORY: The patient is GX P1. First live birth age 24. She does not recall when she went through the change of life. She never took hormone replacement.   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 (She went by "[name]"). She is divorced, and lives in a retirement community, independent living section. Her son, [name], is a Barrister's clerk judge in [place]. The patient has 2 grandchildren, no greatgrandchildren. She is a Tourist information centre manager.   ADVANCED DIRECTIVES: The patient's son [name] is her healthcare power of attorney.  HEALTH MAINTENANCE: History  Substance Use Topics  . Smoking status: Former Smoker    Types: Cigarettes    Quit date: 11/20/1994  . Smokeless tobacco: Former Systems developer    Quit date: 11/20/1994  . Alcohol Use: No     Colonoscopy:  PAP:  Bone density: January 2015  Lipid panel:  Allergies  Allergen Reactions  . Ephedra [Ephedrine]   . Captopril Rash  . Phenobarbital Rash    Current Outpatient Prescriptions  Medication Sig Dispense Refill  . anastrozole (ARIMIDEX) 1 MG tablet TAKE 1 TABLET BY MOUTH DAILY  30 tablet  3  . CHOLECALCIFEROL PO Take 1 tablet by mouth daily.      . Cyanocobalamin (VITAMIN B 12 PO) Take 1 tablet by mouth daily.      . diphenhydrAMINE (BENADRYL) 25 MG tablet Take 25 mg by mouth 2 (two) times daily as needed for itching.      . donepezil (ARICEPT) 10 MG tablet Take 1 tablet (10 mg total) by mouth at bedtime.  30 tablet  12  . loratadine (CLARITIN) 10 MG tablet Take 10 mg by mouth daily as needed for allergies.      . metoprolol (LOPRESSOR) 50 MG tablet Daily.       No current facility-administered medications for this visit.    OBJECTIVE: Elderly white woman who appears well Filed Vitals:   12/15/13 1041  BP: 177/69  Pulse: 71  Temp: 97.4 F (36.3 C)  Resp: 18     Body mass index is 19.28 kg/(m^2).    ECOG FS: 0 Filed Weights   12/15/13 1041  Weight: 98 lb 11.2 oz (44.77 kg)   Sclerae unicteric, pupils equal and reactive to light  Oropharynx clear, full upper and lower plates No cervical or supraclavicular adenopathy Lungs clear to auscultation, no rales or rhonchi Heart regular rate and  rhythm, no murmur appreciated  Abd soft, thin, nontender, positive bowel sounds MSK kyphosis and scoliosis  but no focal spinal tenderness  No upper extremity lymphedema Neuro: nonfocal, well oriented, positive affect Breasts: There is an easily palpated  hard movable mass in the  lower outer quadrant of the right breast, measuring approximately 1.0 cm in diameter.There is no erythema and no skin or nipple changes. The right axilla is benign.  Left breast is unremarkable.   LAB RESULTS: Lab Results  Component Value Date   WBC 7.1 08/11/2013   NEUTROABS 4.5 08/11/2013   HGB 13.8 08/11/2013   HCT 41.1 08/11/2013  MCV 91.2 08/11/2013   PLT 237 08/11/2013      Chemistry      Component Value Date/Time   NA 140 08/11/2013 1456   NA 130* 07/07/2011 1353   K 4.2 08/11/2013 1456   K 3.4* 07/07/2011 1353   CL 106 04/24/2013 0930   CL 94* 07/07/2011 1353   CO2 29 08/11/2013 1456   CO2 31 07/07/2011 1353   BUN 13.7 08/11/2013 1456   BUN 8 07/07/2011 1353   CREATININE 0.9 08/11/2013 1456   CREATININE <0.47* 07/07/2011 1353      Component Value Date/Time   CALCIUM 9.2 08/11/2013 1456   CALCIUM 8.3* 07/07/2011 1353   ALKPHOS 43 08/11/2013 1456   ALKPHOS 63 07/07/2011 1353   AST 18 08/11/2013 1456   AST 16 07/07/2011 1353   ALT 10 08/11/2013 1456   ALT 17 07/07/2011 1353   BILITOT 0.89 08/11/2013 1456   BILITOT 0.4 07/07/2011 1353       Lab Results  Component Value Date   LABCA2 20 07/19/2012    STUDIES: Mm Digital Diagnostic Unilat R  12/05/2013   CLINICAL DATA:  Patient with known right breast cancer declines surgical therapy. Patient on medical therapy.  EXAM: DIGITAL DIAGNOSTIC  RIGHT MAMMOGRAM WITH CAD  COMPARISON:  Prior examinations dating back to a 2013  ACR Breast Density Category b: There are scattered areas of fibroglandular density.  FINDINGS: Multiple projections of the right breast were obtained including spot compression XCC lateral views. There is a oval 1.1 cm mass within the lateral  aspect of the right breast with associated coil biopsy marking clip compatible with known malignancy. This appears grossly similar when compared to prior examinations. Additionally there is a ribbon shaped clip within the central aspect of the right breast. No new masses are identified.  Mammographic images were processed with CAD.  IMPRESSION: No significant interval change known right breast malignancy.  RECOMMENDATION: Clinical followup is recommended.  Imaging follow-up is needed  I have discussed the findings and recommendations with the patient. Results were also provided in writing at the conclusion of the visit. If applicable, a reminder letter will be sent to the patient regarding the next appointment.  BI-RADS CATEGORY  6: Known biopsy-proven malignancy - appropriate action should be taken.   Electronically Signed   By: Lovey Newcomer M.D.   On: 12/05/2013 12:27      ASSESSMENT: 80 y.o. [place] woman   (1)  status post Right breast biopsy 06/27/2012 for a ductal carcinoma in situ, with possible microinvasion, strongly estrogen and progesterone receptor positive.  (2)  declined surgery, treated with anastrazole since September 2013  (3) osteoporosis documented by bone density December 2013;  receiving zoledronic acid, first dose given in January 2014, to be given yearly   PLAN:  Catie is tolerating the anastrozole with no side effects that she is aware of, and the plan is to continue that indefinitely, unless she decides to have the tumor removed. We did discuss that again today and she still feels "it's not bothering me" so she does not want to have surgery, though she understands it would be minor.  She does have osteoporosis and she had a bone density today. That should be improved from baseline but I do not have those results. She will receive zolendronate today and she has a good understanding of the possible toxicities, side effects and complications including issues regarding  osteonecrosis of the jaw, which would be extremely unusual in her situation.  She will have her return visit here in 6 months, after her next right mammogram, and then see me again in a year. We will repeat zolendronate at that time. She knows to call for any problems that may develop before next visit.  Demarques Pilz C    12/15/2013

## 2013-12-15 NOTE — Patient Instructions (Signed)

## 2013-12-15 NOTE — Telephone Encounter (Signed)
Per staff message and POF I have scheduled appts.  JMW  

## 2013-12-28 ENCOUNTER — Emergency Department (HOSPITAL_COMMUNITY): Payer: Medicare Other

## 2013-12-28 ENCOUNTER — Emergency Department (HOSPITAL_COMMUNITY)
Admission: EM | Admit: 2013-12-28 | Discharge: 2013-12-29 | Disposition: A | Discharge status: Home / Self Care | Payer: Medicare Other | Attending: Emergency Medicine | Admitting: Emergency Medicine

## 2013-12-28 ENCOUNTER — Encounter (HOSPITAL_COMMUNITY): Payer: Self-pay | Admitting: Emergency Medicine

## 2013-12-28 DIAGNOSIS — Y9389 Activity, other specified: Secondary | ICD-10-CM | POA: Insufficient documentation

## 2013-12-28 DIAGNOSIS — S022XXB Fracture of nasal bones, initial encounter for open fracture: Secondary | ICD-10-CM | POA: Insufficient documentation

## 2013-12-28 DIAGNOSIS — Z87891 Personal history of nicotine dependence: Secondary | ICD-10-CM | POA: Insufficient documentation

## 2013-12-28 DIAGNOSIS — S0003XA Contusion of scalp, initial encounter: Secondary | ICD-10-CM | POA: Insufficient documentation

## 2013-12-28 DIAGNOSIS — W19XXXA Unspecified fall, initial encounter: Secondary | ICD-10-CM

## 2013-12-28 DIAGNOSIS — I10 Essential (primary) hypertension: Secondary | ICD-10-CM | POA: Insufficient documentation

## 2013-12-28 DIAGNOSIS — Z23 Encounter for immunization: Secondary | ICD-10-CM | POA: Insufficient documentation

## 2013-12-28 DIAGNOSIS — W010XXA Fall on same level from slipping, tripping and stumbling without subsequent striking against object, initial encounter: Secondary | ICD-10-CM | POA: Insufficient documentation

## 2013-12-28 DIAGNOSIS — S0083XA Contusion of other part of head, initial encounter: Secondary | ICD-10-CM

## 2013-12-28 DIAGNOSIS — S1093XA Contusion of unspecified part of neck, initial encounter: Secondary | ICD-10-CM

## 2013-12-28 DIAGNOSIS — S0990XA Unspecified injury of head, initial encounter: Secondary | ICD-10-CM | POA: Insufficient documentation

## 2013-12-28 DIAGNOSIS — Z872 Personal history of diseases of the skin and subcutaneous tissue: Secondary | ICD-10-CM | POA: Insufficient documentation

## 2013-12-28 DIAGNOSIS — Z79899 Other long term (current) drug therapy: Secondary | ICD-10-CM | POA: Insufficient documentation

## 2013-12-28 DIAGNOSIS — S022XXA Fracture of nasal bones, initial encounter for closed fracture: Secondary | ICD-10-CM

## 2013-12-28 DIAGNOSIS — Z8669 Personal history of other diseases of the nervous system and sense organs: Secondary | ICD-10-CM | POA: Insufficient documentation

## 2013-12-28 DIAGNOSIS — Z9109 Other allergy status, other than to drugs and biological substances: Secondary | ICD-10-CM | POA: Insufficient documentation

## 2013-12-28 DIAGNOSIS — Z8659 Personal history of other mental and behavioral disorders: Secondary | ICD-10-CM | POA: Insufficient documentation

## 2013-12-28 DIAGNOSIS — H548 Legal blindness, as defined in USA: Secondary | ICD-10-CM | POA: Insufficient documentation

## 2013-12-28 DIAGNOSIS — Z789 Other specified health status: Secondary | ICD-10-CM | POA: Insufficient documentation

## 2013-12-28 DIAGNOSIS — Y9289 Other specified places as the place of occurrence of the external cause: Secondary | ICD-10-CM | POA: Insufficient documentation

## 2013-12-28 DIAGNOSIS — Z98811 Dental restoration status: Secondary | ICD-10-CM | POA: Insufficient documentation

## 2013-12-28 DIAGNOSIS — Z9889 Other specified postprocedural states: Secondary | ICD-10-CM | POA: Insufficient documentation

## 2013-12-28 DIAGNOSIS — Z853 Personal history of malignant neoplasm of breast: Secondary | ICD-10-CM | POA: Insufficient documentation

## 2013-12-28 DIAGNOSIS — S0120XA Unspecified open wound of nose, initial encounter: Secondary | ICD-10-CM | POA: Insufficient documentation

## 2013-12-28 DIAGNOSIS — I2510 Atherosclerotic heart disease of native coronary artery without angina pectoris: Secondary | ICD-10-CM | POA: Insufficient documentation

## 2013-12-28 DIAGNOSIS — S0181XA Laceration without foreign body of other part of head, initial encounter: Secondary | ICD-10-CM

## 2013-12-28 MED ORDER — TETANUS-DIPHTH-ACELL PERTUSSIS 5-2.5-18.5 LF-MCG/0.5 IM SUSP
0.5000 mL | Freq: Once | INTRAMUSCULAR | Status: AC
  Administered 2013-12-29: 0.5 mL via INTRAMUSCULAR
  Filled 2013-12-28: qty 0.5

## 2013-12-28 MED ORDER — LIDOCAINE-EPINEPHRINE-TETRACAINE (LET) SOLUTION
3.0000 mL | Freq: Once | NASAL | Status: AC
  Administered 2013-12-28: 3 mL via TOPICAL
  Filled 2013-12-28: qty 3

## 2013-12-28 MED ORDER — BACITRACIN 500 UNIT/GM EX OINT
1.0000 "application " | TOPICAL_OINTMENT | Freq: Two times a day (BID) | CUTANEOUS | Status: DC
  Administered 2013-12-29: 1 via TOPICAL
  Filled 2013-12-28: qty 0.9

## 2013-12-28 MED ORDER — ACETAMINOPHEN 325 MG PO TABS
650.0000 mg | ORAL_TABLET | Freq: Once | ORAL | Status: AC
  Administered 2013-12-29: 650 mg via ORAL
  Filled 2013-12-28: qty 2

## 2013-12-28 MED ORDER — ACETAMINOPHEN 500 MG PO TABS: 500.0000 mg | ORAL_TABLET | Freq: Four times a day (QID) | ORAL | Status: DC | PRN

## 2013-12-28 MED ORDER — TRAMADOL HCL 50 MG PO TABS: 50.0000 mg | ORAL_TABLET | Freq: Two times a day (BID) | ORAL | Status: DC | PRN

## 2013-12-28 NOTE — ED Notes (Signed)
Pt. In CT. 

## 2013-12-28 NOTE — ED Provider Notes (Signed)
CSN: [id number]     Arrival date & time 12/28/13  1906 History   First MD Initiated Contact with Patient 12/28/13 1932     Chief Complaint  Patient presents with  . Fall   (Consider location/radiation/quality/duration/timing/severity/associated sxs/prior Treatment) HPI Comments: Patient is an 80 year old female past medical history significant for macular degeneration (patient is legally blind), hypertension, coronary artery disease, history of breast cancer presented to the emergency department after falling this evening. Patient states she went to take the trash outside of her foot on something causing her to fall forward landing on both knees and hit her face on the ground. She denies any loss of consciousness or nausea or vomiting. She states she has a mild headache at the site of impact on her face and on bilateral knees. She states it is mild and doesn't require anything to control. Denies any CP, SOB, HA, lightheadedness, dizziness, syncope prior to fall.    Past Medical History  Diagnosis Date  . Cancer     breast  . Psoriasis   . Legally blind     macular degeneration  . Hypertension   . CAD (coronary artery disease) 2007    Last cath 2007, Last echo-09/01/10 EF 123456, RV Systolic 0000000 mmHg;Last Nuc 2006 no ischemia  . Pulmonary HTN     mild  . Seizure disorder        . H/O syncope     remotely, no etiology found, mild carotid disease on doppler, 0-49% bil  . Full dentures   . Wears glasses   . Lives in independent group home     [facility]  . Memory deficit   . Seasonal allergies   . Hallucinations    Past Surgical History  Procedure Laterality Date  . Cholecystectomy    . Spine surgery    . Appendectomy    . Tonsillectomy    . Cardiac catheterization  11/22/2005    RCA with 20-30% eccentric narrowing other vessels free of disease  . Carpal tunnel release Right 07/29/2013    Procedure: CARPAL TUNNEL RELEASE;  Surgeon: [name], MD;   Location: [facility];  Service: Orthopedics;  Laterality: Right;  . Gallbladder surgery     Family History  Problem Relation Age of Onset  . Pulmonary embolism Mother 48    caused her death  . CAD Father 61    caused his death  . Stroke Father   . Leukemia Brother 60    cause of death  . CAD Brother   . CAD Brother    History  Substance Use Topics  . Smoking status: Former Smoker    Types: Cigarettes    Quit date: 11/20/1994  . Smokeless tobacco: Former Systems developer    Quit date: 11/20/1994  . Alcohol Use: No   OB History   Grav Para Term Preterm Abortions TAB SAB Ect Mult Living                 Review of Systems  Constitutional: Negative for fever and chills.  HENT: Positive for facial swelling.   Respiratory: Negative for shortness of breath.   Cardiovascular: Negative for chest pain.  Gastrointestinal: Negative for nausea, vomiting and abdominal pain.  Musculoskeletal: Positive for myalgias.  Skin: Positive for wound (laceration).  Neurological: Positive for headaches. Negative for dizziness, syncope and light-headedness.  All other systems reviewed and are negative.    Allergies  Ephedra; Captopril; and Phenobarbital  Home Medications   Current  Outpatient Rx  Name  Route  Sig  Dispense  Refill  . acetaminophen (TYLENOL) 500 MG tablet   Oral   Take 1 tablet (500 mg total) by mouth every 6 (six) hours as needed.   15 tablet   0   . anastrozole (ARIMIDEX) 1 MG tablet      TAKE 1 TABLET BY MOUTH DAILY   30 tablet   3   . CHOLECALCIFEROL PO   Oral   Take 1 tablet by mouth daily.         . Cyanocobalamin (VITAMIN B 12 PO)   Oral   Take 1 tablet by mouth daily.         . diphenhydrAMINE (BENADRYL) 25 MG tablet   Oral   Take 25 mg by mouth 2 (two) times daily as needed for itching.         . donepezil (ARICEPT) 10 MG tablet   Oral   Take 1 tablet (10 mg total) by mouth at bedtime.   30 tablet   12   . loratadine (CLARITIN) 10 MG  tablet   Oral   Take 10 mg by mouth daily as needed for allergies.         . metoprolol (LOPRESSOR) 50 MG tablet      Daily.         . traMADol (ULTRAM) 50 MG tablet   Oral   Take 1 tablet (50 mg total) by mouth every 12 (twelve) hours as needed for severe pain.   10 tablet   0    BP 137/72  Pulse 83  Resp 25  SpO2 97% Physical Exam  Constitutional: She is oriented to person, place, and time. She appears well-developed and well-nourished. No distress.  HENT:  Head: Normocephalic. Head is with laceration (0.5 cm laceration noted to nasal bridge).    Right Ear: External ear normal.  Left Ear: External ear normal.  Nose: Nose normal.  Mouth/Throat: Oropharynx is clear and moist. No oropharyngeal exudate.  Eyes: Conjunctivae and EOM are normal. Pupils are equal, round, and reactive to light.  Neck: Normal range of motion. Neck supple.  Cardiovascular: Normal rate, regular rhythm, normal heart sounds and intact distal pulses.   Pulmonary/Chest: Effort normal and breath sounds normal. No respiratory distress.  Abdominal: Soft. There is no tenderness.  Neurological: She is alert and oriented to person, place, and time. She has normal strength. No cranial nerve deficit or sensory deficit. GCS eye subscore is 4. GCS verbal subscore is 5. GCS motor subscore is 6.  No pronator drift. Bilateral heel-knee-shin intact.  Skin: Skin is warm and dry. She is not diaphoretic.    ED Course  Procedures (including critical care time) Medications  acetaminophen (TYLENOL) tablet 650 mg (not administered)  bacitracin ointment 1 application (not administered)  Tdap (BOOSTRIX) injection 0.5 mL (not administered)  lidocaine-EPINEPHrine-tetracaine (LET) solution (3 mLs Topical Given 12/28/13 2130)   LACERATION REPAIR Performed by: Harlow Mares Authorized by: Harlow Mares Consent: Verbal consent obtained. Risks and benefits: risks, benefits and alternatives were  discussed Consent given by: patient Patient identity confirmed: provided demographic data Prepped and Draped in normal sterile fashion Wound explored  Laceration Location: nasal bridge  Laceration Length: 0.5cm  No Foreign Bodies seen or palpated  Anesthesia: local infiltration  Local anesthetic: LET  Anesthetic total: 3 ml  Irrigation method: syringe Amount of cleaning: standard  Skin closure: 5-0 Prolene  Number of sutures: 1  Technique: simple interrupted  Patient  tolerance: Patient tolerated the procedure well with no immediate complications.     Labs Review Labs Reviewed - No data to display Imaging Review Ct Head Wo Contrast  12/28/2013   CLINICAL DATA:  Fall. Altered mental status. Confusion. Laceration of the bridge of the nose.  EXAM: CT HEAD WITHOUT CONTRAST  CT MAXILLOFACIAL WITHOUT CONTRAST  CT CERVICAL SPINE WITHOUT CONTRAST  TECHNIQUE: Multidetector CT imaging of the head, cervical spine, and maxillofacial structures were performed using the standard protocol without intravenous contrast. Multiplanar CT image reconstructions of the cervical spine and maxillofacial structures were also generated.  COMPARISON:  CT HEAD W/O CM dated 07/19/2010; CT-HEADBRAIN dated 12/26/2006; CT-HEADBRAIN dated 11/22/2006  FINDINGS: CT HEAD FINDINGS  There is a small area increased attenuation in the right parietal lobe, which appears similar to the prior study of 07/19/2010. This is most compatible with a developmental venous anomaly with prominent vessel. The concha acuity is increased by a chronic ischemic white matter disease adjacent to the atrium of the left lateral ventricle. No mass lesion, mass effect, midline shift, hydrocephalus, hemorrhage. No acute territorial cortical ischemia/infarct. Atrophy and chronic ischemic white matter disease is present. The calvarium is intact. Chronic hyperostosis of the left frontal bone, probably representing an old calcified meningioma. Incidental  note is made of failure of fusion of the posterior arch of C1. Mild soft tissue swelling is present over the right forehead.  CT MAXILLOFACIAL FINDINGS  As noted above, soft tissue swelling compatible with contusion over the right forehead. Frontal sinuses are within normal limits. Small mucous retention cyst or polyp in the right sphenoid sinus. Mild ethmoid mucosal thickening. Laceration is present over the bridge of the nose. Bilateral comminuted mildly depressed/ displaced nasal bone fractures are present. Deformity on the right is greater than the left with rightward displacement of the right nasal bone at the nasal process of the maxilla. Laceration is present over the right infraorbital region. The globes appear intact. Mandibular condyles appear located. Pterygoid plates intact. Bilateral maxillary mucous retention cyst/polyps. 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  CT CERVICAL SPINE FINDINGS  Cervical spine degenerative disease is moderate to severe. Atlantodental degenerative disease. Ankylosis of C5-C6. Degenerative anterolisthesis of C3 on C4 associated with facet arthrosis. There is no acute cervical spine fracture or dislocation. Lung apices demonstrate dependent atelectasis and/ scarring. The odontoid appears intact. The occipital condyles are intact.  IMPRESSION: 1. Atrophy and chronic ischemic white matter disease without acute intracranial abnormality. Developmental venous anomaly in the left parietal lobe. 2. Nasal bridge laceration, comminuted bilateral mildly displaced nasal bone fractures and right infraorbital laceration. Right forehead contusion. 3. Cervical spine degenerative disease without acute abnormality.   Electronically Signed   By: Dereck Ligas M.D.   On: 12/28/2013 23:28   Ct Cervical Spine Wo Contrast  12/28/2013   CLINICAL DATA:  Fall. Altered mental status. Confusion. Laceration of the bridge of the nose.  EXAM: CT HEAD WITHOUT CONTRAST  CT MAXILLOFACIAL  WITHOUT CONTRAST  CT CERVICAL SPINE WITHOUT CONTRAST  TECHNIQUE: Multidetector CT imaging of the head, cervical spine, and maxillofacial structures were performed using the standard protocol without intravenous contrast. Multiplanar CT image reconstructions of the cervical spine and maxillofacial structures were also generated.  COMPARISON:  CT HEAD W/O CM dated 07/19/2010; CT-HEADBRAIN dated 12/26/2006; CT-HEADBRAIN dated 11/22/2006  FINDINGS: CT HEAD FINDINGS  There is a small area increased attenuation in the right parietal lobe, which appears similar to the prior study of 07/19/2010. This is most compatible with a  developmental venous anomaly with prominent vessel. The concha acuity is increased by a chronic ischemic white matter disease adjacent to the atrium of the left lateral ventricle. No mass lesion, mass effect, midline shift, hydrocephalus, hemorrhage. No acute territorial cortical ischemia/infarct. Atrophy and chronic ischemic white matter disease is present. The calvarium is intact. Chronic hyperostosis of the left frontal bone, probably representing an old calcified meningioma. Incidental note is made of failure of fusion of the posterior arch of C1. Mild soft tissue swelling is present over the right forehead.  CT MAXILLOFACIAL FINDINGS  As noted above, soft tissue swelling compatible with contusion over the right forehead. Frontal sinuses are within normal limits. Small mucous retention cyst or polyp in the right sphenoid sinus. Mild ethmoid mucosal thickening. Laceration is present over the bridge of the nose. Bilateral comminuted mildly depressed/ displaced nasal bone fractures are present. Deformity on the right is greater than the left with rightward displacement of the right nasal bone at the nasal process of the maxilla. Laceration is present over the right infraorbital region. The globes appear intact. Mandibular condyles appear located. Pterygoid plates intact. Bilateral maxillary mucous  retention cyst/polyps. 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  CT CERVICAL SPINE FINDINGS  Cervical spine degenerative disease is moderate to severe. Atlantodental degenerative disease. Ankylosis of C5-C6. Degenerative anterolisthesis of C3 on C4 associated with facet arthrosis. There is no acute cervical spine fracture or dislocation. Lung apices demonstrate dependent atelectasis and/ scarring. The odontoid appears intact. The occipital condyles are intact.  IMPRESSION: 1. Atrophy and chronic ischemic white matter disease without acute intracranial abnormality. Developmental venous anomaly in the left parietal lobe. 2. Nasal bridge laceration, comminuted bilateral mildly displaced nasal bone fractures and right infraorbital laceration. Right forehead contusion. 3. Cervical spine degenerative disease without acute abnormality.   Electronically Signed   By: Dereck Ligas M.D.   On: 12/28/2013 23:28   Dg Knee Complete 4 Views Left  12/28/2013   CLINICAL DATA:  Status post fall with pain  EXAM: LEFT KNEE - COMPLETE 4+ VIEW  COMPARISON:  None.  FINDINGS: There is no evidence of fracture, dislocation, or joint effusion. Soft tissues are unremarkable.  IMPRESSION: No acute fracture or dislocation.   Electronically Signed   By: Abelardo Diesel M.D.   On: 12/28/2013 21:27   Dg Knee Complete 4 Views Right  12/28/2013   CLINICAL DATA:  Fall, knee pain.  EXAM: RIGHT KNEE - COMPLETE 4+ VIEW  COMPARISON:  None.  FINDINGS: No acute bony abnormality. Specifically, no fracture, subluxation, or dislocation. Soft tissues are intact. No joint effusion. Enthesopathic changes along the superior patella.  IMPRESSION: No acute bony abnormality.   Electronically Signed   By: Rolm Baptise M.D.   On: 12/28/2013 21:32   Ct Maxillofacial Wo Cm  12/28/2013   CLINICAL DATA:  Fall. Altered mental status. Confusion. Laceration of the bridge of the nose.  EXAM: CT HEAD WITHOUT CONTRAST  CT MAXILLOFACIAL WITHOUT CONTRAST  CT  CERVICAL SPINE WITHOUT CONTRAST  TECHNIQUE: Multidetector CT imaging of the head, cervical spine, and maxillofacial structures were performed using the standard protocol without intravenous contrast. Multiplanar CT image reconstructions of the cervical spine and maxillofacial structures were also generated.  COMPARISON:  CT HEAD W/O CM dated 07/19/2010; CT-HEADBRAIN dated 12/26/2006; CT-HEADBRAIN dated 11/22/2006  FINDINGS: CT HEAD FINDINGS  There is a small area increased attenuation in the right parietal lobe, which appears similar to the prior study of 07/19/2010. This is most compatible with a developmental venous  anomaly with prominent vessel. The concha acuity is increased by a chronic ischemic white matter disease adjacent to the atrium of the left lateral ventricle. No mass lesion, mass effect, midline shift, hydrocephalus, hemorrhage. No acute territorial cortical ischemia/infarct. Atrophy and chronic ischemic white matter disease is present. The calvarium is intact. Chronic hyperostosis of the left frontal bone, probably representing an old calcified meningioma. Incidental note is made of failure of fusion of the posterior arch of C1. Mild soft tissue swelling is present over the right forehead.  CT MAXILLOFACIAL FINDINGS  As noted above, soft tissue swelling compatible with contusion over the right forehead. Frontal sinuses are within normal limits. Small mucous retention cyst or polyp in the right sphenoid sinus. Mild ethmoid mucosal thickening. Laceration is present over the bridge of the nose. Bilateral comminuted mildly depressed/ displaced nasal bone fractures are present. Deformity on the right is greater than the left with rightward displacement of the right nasal bone at the nasal process of the maxilla. Laceration is present over the right infraorbital region. The globes appear intact. Mandibular condyles appear located. Pterygoid plates intact. Bilateral maxillary mucous retention cyst/polyps. Right  temporomandibular joint osteoarthritis is severe.  CT CERVICAL SPINE FINDINGS  Cervical spine degenerative disease is moderate to severe. Atlantodental degenerative disease. Ankylosis of C5-C6. Degenerative anterolisthesis of C3 on C4 associated with facet arthrosis. There is no acute cervical spine fracture or dislocation. Lung apices demonstrate dependent atelectasis and/ scarring. The odontoid appears intact. The occipital condyles are intact.  IMPRESSION: 1. Atrophy and chronic ischemic white matter disease without acute intracranial abnormality. Developmental venous anomaly in the left parietal lobe. 2. Nasal bridge laceration, comminuted bilateral mildly displaced nasal bone fractures and right infraorbital laceration. Right forehead contusion. 3. Cervical spine degenerative disease without acute abnormality.   Electronically Signed   By: Dereck Ligas M.D.   On: 12/28/2013 23:28    EKG Interpretation    Date/Time:  Sunday December 28 2013 19:15:32 EST Ventricular Rate:  78 PR Interval:  129 QRS Duration: 116 QT Interval:  393 QTC Calculation: 448 R Axis:   88 Text Interpretation:  Sinus rhythm Nonspecific intraventricular conduction delay Anteroseptal infarct, old Repol abnrm suggests ischemia, lateral leads Artifact in lead(s) I II III aVR aVL aVF V1 V2 Confirmed by MASNERI  MD, DAVID ([id number]) on 12/28/2013 7:28:49 PM            MDM   1. Fall   2. Facial laceration   3. Nasal bone fracture     Filed Vitals:   12/28/13 2141  BP: 137/72  Pulse:   Resp: 25   Afebrile, NAD, non-toxic appearing, AAOx4. No neuro focal deficits. Pain managed in ED.   1) Fall: Patient with mechanical fall. No neuro focal deficits. Facial bruising and swelling noted. CT scan obtained without acute finding intracranially only cervical spine. Nasal fracture appreciated on CT maxillofacial. EKG was obtained in triage prior to evaluation the patient. It was reviewed. In oscillation requires further  laboratory workup for mechanical fall.  2) Nasal fracture: Nasal fracture appreciated on CT scan. No septal hematoma or deviation appreciated on physical exam. For her to ENT for further management and evaluation as outpatient.   3) Laceration: Tdap booster given. Wound cleaning complete with pressure irrigation, bottom of wound visualized, no foreign bodies appreciated. Laceration occurred < 8 hours prior to repair which was well tolerated. Pt has no co morbidities to effect normal wound healing. Discussed suture home care w pt and answered questions. Pt  to f-u for wound check and suture removal in 7 days.   Pt is hemodynamically stable w no complaints prior to dc. Return precautions discussed. Patient is agreeable to plan. Patient is stable at time of discharge. Patient d/w with Dr. Curly Rim. , agrees with plan.          Van Horne, PA-C 12/29/13 0105

## 2013-12-28 NOTE — ED Notes (Signed)
Pt. In CT/xray-+

## 2013-12-28 NOTE — ED Notes (Signed)
Pt. States that she tripped over a root bringing the trash out. Denies LOC. Pt. Is alert and oriented to person, place and situation, disoriented to time. Hx of memory loss. Pt. Has laceration to bridge of nose. C/o bilateral knee pain, skin tears to bilateral knees.

## 2013-12-28 NOTE — ED Notes (Addendum)
Per EMS: pt fell over a root approx 45 min pta. Pt lives alone at home. Walked to neighbors house across the street to call 911. Neighbors did not have much information about patient and patient was slight alerted, when EMS arrived pt initially did not know what happened prior to the fall. Pt has a cut to the bridge of her nose (bandage applied bleeding controlled) and complaining of bilateral knee pain. Pt cleared by EMS C-spine.

## 2013-12-28 NOTE — Discharge Instructions (Signed)
Please follow up with your primary care physician in 1-2 days. If you do not have one please call the [facility] number listed above. Please follow up with Dr. Constance Holster to schedule a follow up appointment. Please take pain medication as prescribed and as needed for pain. Please do not drive on narcotic pain medication. Please read all discharge instructions and return precautions.   Nasal Fracture A nasal fracture is a break or crack in the bones of the nose. A minor break usually heals in a month. You often will receive black eyes from a nasal fracture. This is not a cause for concern. The black eyes will go away over 1 to 2 weeks.  DIAGNOSIS  Your caregiver may want to examine you if you are concerned about a fracture of the nose. X-rays of the nose may not show a nasal fracture even when one is present. Sometimes your caregiver must wait 1 to 5 days after the injury to re-check the nose for alignment and to take additional X-rays. Sometimes the caregiver must wait until the swelling has gone down. TREATMENT Minor fractures that have caused no deformity often do not require treatment. More serious fractures where bones are displaced may require surgery. This will take place after the swelling is gone. Surgery will stabilize and align the fracture. HOME CARE INSTRUCTIONS   Put ice on the injured area.  Put ice in a plastic bag.  Place a towel between your skin and the bag.  Leave the ice on for 15-20 minutes, 03-04 times a day.  Take medications as directed by your caregiver.  Only take over-the-counter or prescription medicines for pain, discomfort, or fever as directed by your caregiver.  If your nose starts bleeding, squeeze the soft parts of the nose against the center wall while you are sitting in an upright position for 10 minutes.  Contact sports should be avoided for at least 3 to 4 weeks or as directed by your caregiver. SEEK MEDICAL CARE IF:  Your pain  increases or becomes severe.  You continue to have nosebleeds.  The shape of your nose does not return to normal within 5 days.  You have pus draining from the nose. SEEK IMMEDIATE MEDICAL CARE IF:   You have bleeding from your nose that does not stop after 20 minutes of pinching the nostrils closed and keeping ice on the nose.  You have clear fluid draining from your nose.  You notice a grape-like swelling on the dividing wall between the nostrils (septum). This is a collection of blood (hematoma) that must be drained to help prevent infection.  You have difficulty moving your eyes.  You have recurrent vomiting. Document Released: 11/03/2000 Document Revised: 01/29/2012 Document Reviewed: 02/20/2011 [facility] Patient Information 2014 [place].  Head Injury, Adult You have received a head injury. It does not appear serious at this time. Headaches and vomiting are common following head injury. It should be easy to awaken from sleeping. Sometimes it is necessary for you to stay in the emergency department for a while for observation. Sometimes admission to the hospital may be needed. After injuries such as yours, most problems occur within the first 24 hours, but side effects may occur up to 7 10 days after the injury. It is important for you to carefully monitor your condition and contact your health care provider or seek immediate medical care if there is a change in your condition. WHAT ARE THE TYPES OF HEAD INJURIES? Head injuries  can be as minor as a bump. Some head injuries can be more severe. More severe head injuries include:  A jarring injury to the brain (concussion).  A bruise of the brain (contusion). This mean there is bleeding in the brain that can cause swelling.  A cracked skull (skull fracture).  Bleeding in the brain that collects, clots, and forms a bump (hematoma). WHAT CAUSES A HEAD INJURY? A serious head injury is most likely to happen to someone who is in  a car wreck and is not wearing a seat belt. Other causes of major head injuries include bicycle or motorcycle accidents, sports injuries, and falls. HOW ARE HEAD INJURIES DIAGNOSED? A complete history of the event leading to the injury and your current symptoms will be helpful in diagnosing head injuries. Many times, pictures of the brain, such as CT or MRI are needed to see the extent of the injury. Often, an overnight hospital stay is necessary for observation.  WHEN SHOULD I SEEK IMMEDIATE MEDICAL CARE?  You should get help right away if:  You have confusion or drowsiness.  You feel sick to your stomach (nauseous) or have continued, forceful vomiting.  You have dizziness or unsteadiness that is getting worse.  You have severe, continued headaches not relieved by medicine. Only take over-the-counter or prescription medicines for pain, fever, or discomfort as directed by your health care provider.  You do not have normal function of the arms or legs or are unable to walk.  You notice changes in the black spots in the center of the colored part of your eye (pupil).  You have a clear or bloody fluid coming from your nose or ears.  You have a loss of vision. During the next 24 hours after the injury, you must stay with someone who can watch you for the warning signs. This person should contact local emergency services (911 in the [place] if you have seizures, you become unconscious, or you are unable to wake up. HOW CAN I PREVENT A HEAD INJURY IN THE FUTURE? The most important factor for preventing major head injuries is avoiding motor vehicle accidents. To minimize the potential for damage to your head, it is crucial to wear seat belts while riding in motor vehicles. Wearing helmets while bike riding and playing collision sports (like football) is also helpful. Also, avoiding dangerous activities around the house will further help reduce your risk of head injury.  WHEN CAN I RETURN TO NORMAL  ACTIVITIES AND ATHLETICS? You should be reevaluated by your health care provider before returning to these activities. If you have any of the following symptoms, you should not return to activities or contact sports until 1 week after the symptoms have stopped:  Persistent headache.  Dizziness or vertigo.  Poor attention and concentration.  Confusion.  Memory problems.  Nausea or vomiting.  Fatigue or tire easily.  Irritability.  Intolerant of bright lights or loud noises.  Anxiety or depression.  Disturbed sleep. MAKE SURE YOU:   Understand these instructions.  Will watch your condition.  Will get help right away if you are not doing well or get worse. Document Released: 11/06/2005 Document Revised: 08/27/2013 Document Reviewed: 07/14/2013 [facility] Patient Information 2014 [place].  Laceration Care, Adult A laceration is a cut or lesion that goes through all layers of the skin and into the tissue just beneath the skin. TREATMENT  Some lacerations may not require closure. Some lacerations may not be able to be closed due to an increased  risk of infection. It is important to see your caregiver as soon as possible after an injury to minimize the risk of infection and maximize the opportunity for successful closure. If closure is appropriate, pain medicines may be given, if needed. The wound will be cleaned to help prevent infection. Your caregiver will use stitches (sutures), staples, wound glue (adhesive), or skin adhesive strips to repair the laceration. These tools bring the skin edges together to allow for faster healing and a better cosmetic outcome. However, all wounds will heal with a scar. Once the wound has healed, scarring can be minimized by covering the wound with sunscreen during the day for 1 full year. HOME CARE INSTRUCTIONS  For sutures or staples:  Keep the wound clean and dry.  If you were given a bandage (dressing), you should change it at least  once a day. Also, change the dressing if it becomes wet or dirty, or as directed by your caregiver.  Wash the wound with soap and water 2 times a day. Rinse the wound off with water to remove all soap. Pat the wound dry with a clean towel.  After cleaning, apply a thin layer of the antibiotic ointment as recommended by your caregiver. This will help prevent infection and keep the dressing from sticking.  You may shower as usual after the first 24 hours. Do not soak the wound in water until the sutures are removed.  Only take over-the-counter or prescription medicines for pain, discomfort, or fever as directed by your caregiver.  Get your sutures or staples removed as directed by your caregiver. For skin adhesive strips:  Keep the wound clean and dry.  Do not get the skin adhesive strips wet. You may bathe carefully, using caution to keep the wound dry.  If the wound gets wet, pat it dry with a clean towel.  Skin adhesive strips will fall off on their own. You may trim the strips as the wound heals. Do not remove skin adhesive strips that are still stuck to the wound. They will fall off in time. For wound adhesive:  You may briefly wet your wound in the shower or bath. Do not soak or scrub the wound. Do not swim. Avoid periods of heavy perspiration until the skin adhesive has fallen off on its own. After showering or bathing, gently pat the wound dry with a clean towel.  Do not apply liquid medicine, cream medicine, or ointment medicine to your wound while the skin adhesive is in place. This may loosen the film before your wound is healed.  If a dressing is placed over the wound, be careful not to apply tape directly over the skin adhesive. This may cause the adhesive to be pulled off before the wound is healed.  Avoid prolonged exposure to sunlight or tanning lamps while the skin adhesive is in place. Exposure to ultraviolet light in the first year will darken the scar.  The skin  adhesive will usually remain in place for 5 to 10 days, then naturally fall off the skin. Do not pick at the adhesive film. You may need a tetanus shot if:  You cannot remember when you had your last tetanus shot.  You have never had a tetanus shot. If you get a tetanus shot, your arm may swell, get red, and feel warm to the touch. This is common and not a problem. If you need a tetanus shot and you choose not to have one, there is a rare chance of getting  tetanus. Sickness from tetanus can be serious. SEEK MEDICAL CARE IF:   You have redness, swelling, or increasing pain in the wound.  You see a red line that goes away from the wound.  You have yellowish-white fluid (pus) coming from the wound.  You have a fever.  You notice a bad smell coming from the wound or dressing.  Your wound breaks open before or after sutures have been removed.  You notice something coming out of the wound such as wood or glass.  Your wound is on your hand or foot and you cannot move a finger or toe. SEEK IMMEDIATE MEDICAL CARE IF:   Your pain is not controlled with prescribed medicine.  You have severe swelling around the wound causing pain and numbness or a change in color in your arm, hand, leg, or foot.  Your wound splits open and starts bleeding.  You have worsening numbness, weakness, or loss of function of any joint around or beyond the wound.  You develop painful lumps near the wound or on the skin anywhere on your body. MAKE SURE YOU:   Understand these instructions.  Will watch your condition.  Will get help right away if you are not doing well or get worse. Document Released: 11/06/2005 Document Revised: 01/29/2012 Document Reviewed: 05/02/2011 [facility] Patient Information 2014 [place], [place].

## 2013-12-29 NOTE — ED Provider Notes (Signed)
Medical screening examination/treatment/procedure(s) were conducted as a shared visit with non-physician practitioner(s) and myself.  I personally evaluated the patient during the encounter.  EKG Interpretation    Date/Time:  Sunday December 28 2013 19:15:32 EST Ventricular Rate:  78 PR Interval:  129 QRS Duration: 116 QT Interval:  393 QTC Calculation: 448 R Axis:   88 Text Interpretation:  Sinus rhythm Nonspecific intraventricular conduction delay Anteroseptal infarct, old Repol abnrm suggests ischemia, lateral leads Artifact in lead(s) I II III aVR aVL aVF V1 V2 Confirmed by MASNERI  MD, DAVID ([id number]) on 12/28/2013 7:28:49 PM            [facility] fall.  CT negative except for nasal bone fracture.  Laceration repair.  Outpatient follow up with ENT and PCP.  ER precautions given.   Elmer Sow, MD 12/29/13 [phone]

## 2013-12-29 NOTE — ED Notes (Signed)
Pt. Ambulatory on discharge

## 2013-12-29 NOTE — ED Notes (Signed)
Wound care completed, sterile dressing applied to nose and bilateral knees.

## 2014-01-08 ENCOUNTER — Other Ambulatory Visit: Payer: Self-pay | Admitting: Oncology

## 2014-01-08 DIAGNOSIS — C50419 Malignant neoplasm of upper-outer quadrant of unspecified female breast: Secondary | ICD-10-CM

## 2014-01-28 NOTE — Progress Notes (Signed)
Rcvd report from [place] - provided to Dr. Humphrey Rolls

## 2014-04-22 ENCOUNTER — Telehealth: Payer: Self-pay

## 2014-04-22 NOTE — Telephone Encounter (Signed)
Spoke [place] improved.  Ms. [name] reports pt is taking zometa 1x/yr,  [place] informed.

## 2014-06-08 ENCOUNTER — Telehealth: Payer: Self-pay | Admitting: *Deleted

## 2014-06-08 NOTE — Telephone Encounter (Signed)
Called patient to r/s appointment due to CM is unavailable, patient was r/s to 06/30/14 at 9 am.

## 2014-06-15 ENCOUNTER — Ambulatory Visit: Payer: Medicare Other | Admitting: Nurse Practitioner

## 2014-06-30 ENCOUNTER — Ambulatory Visit (INDEPENDENT_AMBULATORY_CARE_PROVIDER_SITE_OTHER): Payer: Medicare Other | Admitting: Nurse Practitioner

## 2014-06-30 ENCOUNTER — Encounter: Payer: Self-pay | Admitting: Nurse Practitioner

## 2014-06-30 VITALS — BP 160/72 | HR 73 | Ht 60.0 in | Wt 105.2 lb

## 2014-06-30 DIAGNOSIS — R413 Other amnesia: Secondary | ICD-10-CM

## 2014-06-30 DIAGNOSIS — R441 Visual hallucinations: Secondary | ICD-10-CM

## 2014-06-30 DIAGNOSIS — H5316 Psychophysical visual disturbances: Secondary | ICD-10-CM

## 2014-06-30 MED ORDER — DONEPEZIL HCL 10 MG PO TABS: 10.0000 mg | ORAL_TABLET | Freq: Every day | ORAL | Status: DC

## 2014-06-30 NOTE — Progress Notes (Signed)
[facility]  PATIENT: [name] DOB: 1925/06/13   REASON FOR VISIT: Followup for memory loss   HISTORY OF PRESENT ILLNESS: HISTORY: [name] is 80 year old female, accompanied by her caregiver [name], referred by her primary care physician Dr. Chapman Fitch for evaluation of memory loss, hallucinations  She had past medical history of hypertension, hyperlipidemia, breast cancer, taking Anastrozole.  She was a retired Equities trader, moved to [place] a few years ago, lives alone in her apartment, she has very poor vision, due to macular degeneration,  She enjoys listening to TV, attend some church activities, her friend check on her regularly  Over the past few months, she has been complaining of a person intruding in her apartment, make himself at home, take away her monies, which is very aggravating for her  During the interview,she could not remember the year she retired, the year she moved to [place]  She has good appetite, sleep well, she has no gait difficulty  UPDATE:06/30/14 [name], 80 year old female returns for followup. She has a history of memory loss was last seen by Dr. Krista Blue 12/08/2013. She was placed on Aricept at that time titrating to 10 mg. She is taking it at night having more confusion and hallucinations. She returns today with her caregiver [name]. She continues to live in her private  apartment, she has good appetite and she is sleeping well at night. She has had one fall in the last 6 months, she tripped over a root in her yard. She has poor vision due to macular degeneration. She denies any GI side effects to Aricept. She returns for reevaluation  REVIEW OF SYSTEMS: Full 14 system review of systems performed and notable only for those listed, all others are neg:  Constitutional: N/A  Cardiovascular: N/A  Ear/Nose/Throat: N/A  Skin: N/A  Eyes: N/A  Respiratory: Cough due to allergies Gastroitestinal: N/A  Hematology/Lymphatic: N/A  Endocrine:  N/A Musculoskeletal:N/A  Allergy/Immunology: Environmental allergies Neurological: Memory loss Psychiatric: Depression anxiety, occasional hallucinations Sleep : NA   ALLERGIES: Allergies  Allergen Reactions  . Ephedra [Ephedrine] Other (See Comments)    unknown  . Captopril Rash  . Phenobarbital Rash    HOME MEDICATIONS: Outpatient Prescriptions Prior to Visit  Medication Sig Dispense Refill  . anastrozole (ARIMIDEX) 1 MG tablet TAKE 1 TABLET BY MOUTH DAILY  30 tablet  6  . loratadine (CLARITIN) 10 MG tablet Take 10 mg by mouth daily as needed for allergies.      . metoprolol (LOPRESSOR) 50 MG tablet Daily.      Marland Kitchen donepezil (ARICEPT) 10 MG tablet Take 1 tablet (10 mg total) by mouth at bedtime.  30 tablet  12  . acetaminophen (TYLENOL) 500 MG tablet Take 1 tablet (500 mg total) by mouth every 6 (six) hours as needed.  15 tablet  0  . CHOLECALCIFEROL PO Take 1 tablet by mouth daily.      . Cyanocobalamin (VITAMIN B 12 PO) Take 1 tablet by mouth daily.      . diphenhydrAMINE (BENADRYL) 25 MG tablet Take 25 mg by mouth 2 (two) times daily as needed for itching.      . traMADol (ULTRAM) 50 MG tablet Take 1 tablet (50 mg total) by mouth every 12 (twelve) hours as needed for severe pain.  10 tablet  0   No facility-administered medications prior to visit.    PAST MEDICAL HISTORY: Past Medical History  Diagnosis Date  . Cancer     breast  . Psoriasis   .  Legally blind     macular degeneration  . Hypertension   . CAD (coronary artery disease) 2007    Last cath 2007, Last echo-09/01/10 EF >59%, RV Systolic [id number] mmHg;Last Nuc 2006 no ischemia  . Pulmonary HTN     mild  . Seizure disorder        . H/O syncope     remotely, no etiology found, mild carotid disease on doppler, 0-49% bil  . Full dentures   . Wears glasses   . Lives in independent group home     [facility]-  . Memory deficit   . Seasonal allergies   . Hallucinations     PAST  SURGICAL HISTORY: Past Surgical History  Procedure Laterality Date  . Cholecystectomy    . Spine surgery    . Appendectomy    . Tonsillectomy    . Cardiac catheterization  11/22/2005    RCA with 20-30% eccentric narrowing other vessels free of disease  . Carpal tunnel release Right 07/29/2013    Procedure: CARPAL TUNNEL RELEASE;  Surgeon: [name], MD;  Location: [facility];  Service: Orthopedics;  Laterality: Right;  . Gallbladder surgery      FAMILY HISTORY: Family History  Problem Relation Age of Onset  . Pulmonary embolism Mother 7    caused her death  . CAD Father 33    caused his death  . Stroke Father   . Leukemia Brother 60    cause of death  . CAD Brother   . CAD Brother     SOCIAL HISTORY: History   Social History  . Marital Status: Divorced    Spouse Name: N/A    Number of Children: 1  . Years of Education: college   Occupational History  .      Retired - Therapist, sports   Social History Main Topics  . Smoking status: Former Smoker    Types: Cigarettes    Quit date: 11/20/1994  . Smokeless tobacco: Former Systems developer    Quit date: 11/20/1994  . Alcohol Use: No  . Drug Use: No  . Sexual Activity: No   Other Topics Concern  . Not on file   Social History Narrative   Patient lives at home alone. Patient is divorced.   Retired Therapist, sports.   College education.   Right handed.   Caffeine- two cups daily coffee.     PHYSICAL EXAM  Filed Vitals:   06/30/14 0859  BP: 160/72  Pulse: 73  Height: 5' (1.524 m)  Weight: 105 lb 3.2 oz (47.718 kg)   Body mass index is 20.55 kg/(m^2). Generalized: In no acute distress  Neck: Supple, no carotid bruits  Cardiac: Regular rate rhythm  Pulmonary: Clear to auscultation bilaterally  Musculoskeletal: No deformity  Neurological examination  Mentation: Alert oriented to time, place, history taking, and causual conversation, Mini-Mental Status Examination is 18 out of 30, she is not oriented to date, year, place, has  difficulty spell words backwards, missed  3 out of 3 recalls, unable to copy a figure.AFT 4 Cranial nerve II-XII: Pupils were equal round reactive to light extraocular movements were full, Visual field were full on confrontational test.  Facial sensation and strength were normal. Hearing was intact to finger rubbing bilaterally. Uvula tongue midline. head turning and shoulder shrug and were normal and symmetric.Tongue protrusion into cheek strength was normal.  Motor: normal tone, bulk and strength.  Sensory: Intact to fine touch, pinprick, preserved vibratory sensation, and proprioception at toes.  Coordination: Normal finger to nose, heel-to-shin bilaterally   Gait: Rising up from seated position without assistance, normal stance, Steady gait no assistive device. Romberg signs: Negative  Deep tendon reflexes: Brachioradialis 2/2, biceps 2/2, triceps 2/2, patellar 2/2, Achilles 2/2, plantar responses were flexor bilaterally.   DIAGNOSTIC DATA (LABS, IMAGING, TESTING) - I reviewed patient records, labs, notes, testing and imaging myself where available.  Lab Results  Component Value Date   WBC 7.1 08/11/2013   HGB 13.8 08/11/2013   HCT 41.1 08/11/2013   MCV 91.2 08/11/2013   PLT 237 08/11/2013      Component Value Date/Time   NA 142 12/15/2013 1022   NA 130* 07/07/2011 1353   K 4.3 12/15/2013 1022   K 3.4* 07/07/2011 1353   CL 106 04/24/2013 0930   CL 94* 07/07/2011 1353   CO2 30* 12/15/2013 1022   CO2 31 07/07/2011 1353   GLUCOSE 120 12/15/2013 1022   GLUCOSE 104* 04/24/2013 0930   GLUCOSE 143* 07/07/2011 1353   BUN 9.3 12/15/2013 1022   BUN 8 07/07/2011 1353   CREATININE 0.8 12/15/2013 1022   CREATININE <0.47* 07/07/2011 1353   CALCIUM 9.3 12/15/2013 1022   CALCIUM 8.3* 07/07/2011 1353   PROT 6.4 08/11/2013 1456   PROT 6.3 07/07/2011 1353   ALBUMIN 3.3* 08/11/2013 1456   ALBUMIN 3.1* 07/07/2011 1353   AST 18 08/11/2013 1456   AST 16 07/07/2011 1353   ALT 10 08/11/2013 1456   ALT 17 07/07/2011 1353    ALKPHOS 43 08/11/2013 1456   ALKPHOS 63 07/07/2011 1353   BILITOT 0.89 08/11/2013 1456   BILITOT 0.4 07/07/2011 1353   GFRNONAA NOT CALCULATED 07/07/2011 1353   GFRAA NOT CALCULATED 07/07/2011 1353    ASSESSMENT AND PLAN  80 y.o. year old female  has a past medical history of gradual onset of memory difficulty, history of macular degeneration with very poor vision, occasional visual hallucinations. Her diagnosis is most consistent with central nervous system degenerative disorder such as Alzheimer's disease.She is a patient of Dr. Krista Blue who is out of the office.   Continue Aricept, in the morning, will refill If hallucinations continue after changing Aricept to the morning dose can begin Seroquel low dose Please call our office for prescription Followup in 6 months Dennie Bible, [facility], [facility], [facility] [address], [place], [place] [address] [phone]

## 2014-06-30 NOTE — Patient Instructions (Signed)
Continue Aricept, in the morning, will refill If hallucinations continue after changing Aricept the morning dose can begin Seroquel Please call our office for prescription Followup in 6 months

## 2014-07-01 ENCOUNTER — Ambulatory Visit
Admission: RE | Admit: 2014-07-01 | Discharge: 2014-07-01 | Disposition: A | Discharge status: Home / Self Care | Payer: Medicare Other | Source: Ambulatory Visit | Attending: Oncology | Admitting: Oncology

## 2014-07-01 DIAGNOSIS — C50919 Malignant neoplasm of unspecified site of unspecified female breast: Secondary | ICD-10-CM

## 2014-07-03 ENCOUNTER — Other Ambulatory Visit: Payer: Self-pay | Admitting: *Deleted

## 2014-07-03 DIAGNOSIS — C50411 Malignant neoplasm of upper-outer quadrant of right female breast: Secondary | ICD-10-CM

## 2014-07-06 ENCOUNTER — Ambulatory Visit (HOSPITAL_BASED_OUTPATIENT_CLINIC_OR_DEPARTMENT_OTHER): Payer: Medicare Other | Admitting: Nurse Practitioner

## 2014-07-06 ENCOUNTER — Telehealth: Payer: Self-pay | Admitting: Oncology

## 2014-07-06 ENCOUNTER — Telehealth: Payer: Self-pay | Admitting: Nurse Practitioner

## 2014-07-06 ENCOUNTER — Encounter: Payer: Self-pay | Admitting: Nurse Practitioner

## 2014-07-06 ENCOUNTER — Other Ambulatory Visit (HOSPITAL_BASED_OUTPATIENT_CLINIC_OR_DEPARTMENT_OTHER): Payer: Medicare Other

## 2014-07-06 VITALS — BP 177/84 | HR 74 | Temp 97.4°F | Resp 18 | Ht 60.0 in | Wt 102.8 lb

## 2014-07-06 DIAGNOSIS — Z170 Estrogen receptor positive status [ER+]: Secondary | ICD-10-CM

## 2014-07-06 DIAGNOSIS — D0590 Unspecified type of carcinoma in situ of unspecified breast: Secondary | ICD-10-CM

## 2014-07-06 DIAGNOSIS — C50411 Malignant neoplasm of upper-outer quadrant of right female breast: Secondary | ICD-10-CM

## 2014-07-06 DIAGNOSIS — M810 Age-related osteoporosis without current pathological fracture: Secondary | ICD-10-CM

## 2014-07-06 DIAGNOSIS — M8580 Other specified disorders of bone density and structure, unspecified site: Secondary | ICD-10-CM

## 2014-07-06 LAB — CBC WITH DIFFERENTIAL/PLATELET
BASO%: 0.9 % (ref 0.0–2.0)
Basophils Absolute: 0.1 10*3/uL (ref 0.0–0.1)
EOS ABS: 0.3 10*3/uL (ref 0.0–0.5)
EOS%: 5 % (ref 0.0–7.0)
HCT: 42.1 % (ref 34.8–46.6)
HGB: 13.5 g/dL (ref 11.6–15.9)
LYMPH#: 0.6 10*3/uL — AB (ref 0.9–3.3)
LYMPH%: 9.6 % — ABNORMAL LOW (ref 14.0–49.7)
MCH: 29.5 pg (ref 25.1–34.0)
MCHC: 32.1 g/dL (ref 31.5–36.0)
MCV: 91.9 fL (ref 79.5–101.0)
MONO#: 0.9 10*3/uL (ref 0.1–0.9)
MONO%: 15.6 % — ABNORMAL HIGH (ref 0.0–14.0)
NEUT#: 4.1 10*3/uL (ref 1.5–6.5)
NEUT%: 68.9 % (ref 38.4–76.8)
Platelets: 217 10*3/uL (ref 145–400)
RBC: 4.58 10*6/uL (ref 3.70–5.45)
RDW: 13.6 % (ref 11.2–14.5)
WBC: 5.9 10*3/uL (ref 3.9–10.3)

## 2014-07-06 LAB — COMPREHENSIVE METABOLIC PANEL (CC13)
ALBUMIN: 3.2 g/dL — AB (ref 3.5–5.0)
ALT: 6 U/L (ref 0–55)
AST: 19 U/L (ref 5–34)
Alkaline Phosphatase: 42 U/L (ref 40–150)
Anion Gap: 6 mEq/L (ref 3–11)
BUN: 10.6 mg/dL (ref 7.0–26.0)
CALCIUM: 8.7 mg/dL (ref 8.4–10.4)
CO2: 29 mEq/L (ref 22–29)
Chloride: 106 mEq/L (ref 98–109)
Creatinine: 0.8 mg/dL (ref 0.6–1.1)
GLUCOSE: 131 mg/dL (ref 70–140)
POTASSIUM: 4.4 meq/L (ref 3.5–5.1)
Sodium: 142 mEq/L (ref 136–145)
TOTAL PROTEIN: 6 g/dL — AB (ref 6.4–8.3)
Total Bilirubin: 0.9 mg/dL (ref 0.20–1.20)

## 2014-07-06 NOTE — Telephone Encounter (Signed)
, °

## 2014-07-06 NOTE — Progress Notes (Signed)
ID: [name]   DOB: 1925-03-22  MR#: [id number]  OZD#:[id number]  PCP: Antony Blackbird, MD GYN:  SU:  OTHER MD:  CHIEF COMPLAINT: right breast cancer CURRENT THERAPY: anastrozole 1mg  daily  BREAST CANCER HISTORY: [name] palpated a mass in her right breast and brought this to her primary physician's attention. She was set up for bilateral diagnostic mammography with right ultrasonography at the [facility] Center 06/27/2012. There was a posterior high-density mass in the upper outer quadrant of the right breast which was palpable, mobile, and accompanied by a second mass in the right breast and an axillary mass as well. Ultrasound confirmed an oval heterogeneously hypoechoic mass with circumscribed margins in the 9:00 position of the breast measuring 2.1 cm. A second hypoechoic mass was noted closer to the nipple, measuring 1.8 cm. An enlarged lymph node with normal morphology was noted in the axilla.  Biopsy of the 2 breast masses was obtained the same day, and showed ([id number]) ductal carcinoma in situ, with foci suspicious for invasive carcinoma. There were also detached fragments of tissue. Definitive distinction between in situ and invasive tumor could not be made with this material. Tumors were strongly estrogen and progesterone receptor positive (95-100%). The patient's subsequent history is as detailed below.  INTERVAL HISTORY: "[name]"  returns today accompanied by her friend and caregiver, [name], for followup of [name]'s right breast carcinoma. The interval history is generally unremarkable. She has been on anastrozole since September 2013, tolerating it well with no side effects that she is aware of. She will be meeting her son this afternoon for lunch and is excited.   REVIEW OF SYSTEMS: [name] has been somewhat forgetful, she has recently started taking donazepil. She has some left hand pain which is a combination of arthritis and a history of carpel tunnel. Her vision is changing as she  does have macular degeneration as well. A detailed review of systems was otherwise noncontributory.    PAST MEDICAL HISTORY: Past Medical History  Diagnosis Date  . Cancer     breast  . Psoriasis   . Legally blind     macular degeneration  . Hypertension   . CAD (coronary artery disease) 2007    Last cath 2007, Last echo-09/01/10 EF >75%, RV Systolic [id number] mmHg;Last Nuc 2006 no ischemia  . Pulmonary HTN     mild  . Seizure disorder        . H/O syncope     remotely, no etiology found, mild carotid disease on doppler, 0-49% bil  . Full dentures   . Wears glasses   . Lives in independent group home     independent living [facility]-  . Memory deficit   . Seasonal allergies   . Hallucinations     PAST SURGICAL HISTORY: Past Surgical History  Procedure Laterality Date  . Cholecystectomy    . Spine surgery    . Appendectomy    . Tonsillectomy    . Cardiac catheterization  11/22/2005    RCA with 20-30% eccentric narrowing other vessels free of disease  . Carpal tunnel release Right 07/29/2013    Procedure: CARPAL TUNNEL RELEASE;  Surgeon: [name], MD;  Location: [facility];  Service: Orthopedics;  Laterality: Right;  . Gallbladder surgery      FAMILY HISTORY Family History  Problem Relation Age of Onset  . Pulmonary embolism Mother 22    caused her death  . CAD Father 72    caused his death  .  Stroke Father   . Leukemia Brother 60    cause of death  . CAD Brother   . CAD Brother    The patient's father died in his 13s from a myocardial infarction. Her mother also died in her 2s from the same cause. The patient had 2 brothers and 6 sisters. There is no history of breast or ovarian cancer in the immediate family.   GYNECOLOGIC HISTORY: The patient is GX P1. First live birth age 49. She does not recall when she went through the change of life. She never took hormone replacement.   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 (She went by "[name]"). She is divorced, and lives in a retirement community, independent living section. Her son, [name], is a Barrister's clerk judge in [place]. The patient has 2 grandchildren, no greatgrandchildren. She is a Tourist information centre manager.   ADVANCED DIRECTIVES: The patient's son [name] is her healthcare power of attorney.  HEALTH MAINTENANCE: History  Substance Use Topics  . Smoking status: Former Smoker    Types: Cigarettes    Quit date: 11/20/1994  . Smokeless tobacco: Former Systems developer    Quit date: 11/20/1994  . Alcohol Use: No     Colonoscopy:  PAP:  Bone density: January 2015  Lipid panel:  Allergies  Allergen Reactions  . Ephedra [Ephedrine] Other (See Comments)    unknown  . Captopril Rash  . Phenobarbital Rash    Current Outpatient Prescriptions  Medication Sig Dispense Refill  . anastrozole (ARIMIDEX) 1 MG tablet TAKE 1 TABLET BY MOUTH DAILY  30 tablet  6  . donepezil (ARICEPT) 10 MG tablet Take 1 tablet (10 mg total) by mouth daily.  30 tablet  6  . loratadine (CLARITIN) 10 MG tablet Take 10 mg by mouth daily as needed for allergies.      . metoprolol (LOPRESSOR) 50 MG tablet Daily.       No current facility-administered medications for this visit.    OBJECTIVE: Elderly white woman who appears well Filed Vitals:   07/06/14 1154  BP: 177/84  Pulse: 74  Temp: 97.4 F (36.3 C)  Resp: 18     Body mass index is 20.08 kg/(m^2).    ECOG FS: 0 Filed Weights   07/06/14 1154  Weight: 102 lb 12.8 oz (46.63 kg)   Skin: warm, dry  HEENT: sclerae anicteric, conjunctivae pink, oropharynx clear. No thrush or mucositis.  Lymph Nodes: No cervical or supraclavicular lymphadenopathy  Lungs: clear to auscultation bilaterally, no rales, wheezes, or rhonci  Heart: regular rate and rhythm  Abdomen: round, soft, non tender, positive bowel sounds  Musculoskeletal: No focal spinal tenderness, no peripheral edema  Neuro: non focal, well  oriented, positive affect  Breasts: hard mobile mass located at 9:00 in right breast. No erythema or skin changes, no nipple discharge. Right axilla benign. Left breast unremarkable.    LAB RESULTS: Lab Results  Component Value Date   WBC 5.9 07/06/2014   NEUTROABS 4.1 07/06/2014   HGB 13.5 07/06/2014   HCT 42.1 07/06/2014   MCV 91.9 07/06/2014   PLT 217 07/06/2014       Chemistry      Component Value Date/Time   NA 142 12/15/2013 1022   NA 130* 07/07/2011 1353   K 4.3 12/15/2013 1022   K 3.4* 07/07/2011 1353   CL 106 04/24/2013 0930   CL 94* 07/07/2011 1353   CO2 30* 12/15/2013 1022  CO2 31 07/07/2011 1353   BUN 9.3 12/15/2013 1022   BUN 8 07/07/2011 1353   CREATININE 0.8 12/15/2013 1022   CREATININE <0.47* 07/07/2011 1353      Component Value Date/Time   CALCIUM 9.3 12/15/2013 1022   CALCIUM 8.3* 07/07/2011 1353   ALKPHOS 43 08/11/2013 1456   ALKPHOS 63 07/07/2011 1353   AST 18 08/11/2013 1456   AST 16 07/07/2011 1353   ALT 10 08/11/2013 1456   ALT 17 07/07/2011 1353   BILITOT 0.89 08/11/2013 1456   BILITOT 0.4 07/07/2011 1353       Lab Results  Component Value Date   LABCA2 20 07/19/2012    STUDIES: Mm Digital Diagnostic Unilat R  07/01/2014   CLINICAL DATA:  Followup biopsy proven right breast cancer marked with a coil shaped clip. Ribbon shaped clip was previously placed at a separate location in the right breast. The patient is currently undergoing medical therapy but has not has surgery.  EXAM: DIGITAL DIAGNOSTIC  right MAMMOGRAM WITH CAD  COMPARISON:  [facility]  ACR Breast Density Category c: The breast tissue is heterogeneously dense, which may obscure small masses.  FINDINGS: The 1.1 cm mass with internal coil shaped clip is stable in the right far lateral breast, seen only on laterally exaggerated craniocaudad view. This corresponds to the biopsy proven breast cancer. No new abnormality is identified in the right breast.  Mammographic images were processed with CAD.  IMPRESSION: No  significant change in the right breast.  RECOMMENDATION: Treatment plan  I have discussed the findings and recommendations with the patient. Results were also provided in writing at the conclusion of the visit. If applicable, a reminder letter will be sent to the patient regarding the next appointment.  BI-RADS CATEGORY  6: Known biopsy-proven malignancy.   Electronically Signed   By: Conchita Paris M.D.   On: 07/01/2014 10:59   Most recent bone density scan on 12/15/13 showed a t-score of -2.9 in the left femur (osteoporosis), and -2.5 in the lumbar spine (osteoprosis).  ASSESSMENT: 80 y.o. [place] woman   (1)  status post Right breast biopsy 06/27/2012 for a ductal carcinoma in situ, with possible microinvasion, strongly estrogen and progesterone receptor positive.  (2)  declined surgery, treated with anastrazole since September 2013  (3) osteoporosis documented by bone density December 2013;  receiving zoledronic acid, first dose given in January 2014, to be given yearly   PLAN:  Faithe is tolerating the anastrozole well with no side effects that she is aware of. The mass in her right breast is still palpable and mobile. She states it does not bother her and she does not wish to have it removed. The current plan is to continue the anastrozole indefinitely unless she decides to allow for surgical intervention of the tumor. The CBC was reviewed in detail and was stable, the CMP was not available at this time.   Taleigh's last bone density scan still shows osteopenia without much change from the previous scan. She will receive her yearly dose of zolendronate in January 2016. On that same day she will have labs and an office visit with Dr. Jana Hakim for follow up. Telesa understands and agrees with this plan. She and [name] were encouraged to call with a any issues that may arise before her next visit here.   Marcelino Duster    07/06/2014

## 2014-07-15 ENCOUNTER — Telehealth: Payer: Self-pay | Admitting: Nurse Practitioner

## 2014-07-15 MED ORDER — QUETIAPINE FUMARATE 25 MG PO TABS: 25.0000 mg | ORAL_TABLET | Freq: Every day | ORAL | Status: DC

## 2014-07-15 NOTE — Telephone Encounter (Signed)
Last OV note says: If hallucinations continue after changing Aricept to the morning dose can begin Seroquel low dose  Caregiver is requesting we prescribe Seroquel for hallucinations.  I will be happy to add and send Rx, would you like to prescribe 25mg ?.  Thank you.

## 2014-07-15 NOTE — Telephone Encounter (Signed)
Carlota Raspberry, caregiver requesting a Rx for Seroquel for hallucinations.  Please call anytime and advise.

## 2014-07-15 NOTE — Telephone Encounter (Signed)
Rx added and sent.  I spoke with [name].  She is aware.

## 2014-07-15 NOTE — Telephone Encounter (Signed)
Yes and thanks.

## 2014-07-16 ENCOUNTER — Other Ambulatory Visit: Payer: Self-pay | Admitting: Oncology

## 2014-07-16 DIAGNOSIS — C50411 Malignant neoplasm of upper-outer quadrant of right female breast: Secondary | ICD-10-CM

## 2014-11-16 ENCOUNTER — Telehealth: Payer: Self-pay | Admitting: Oncology

## 2014-11-16 NOTE — Telephone Encounter (Signed)
S/w [name] advised appt time chg on 1/26 from 9.30am to 10.15am. [name] verbalized understanding.

## 2014-12-15 ENCOUNTER — Telehealth: Payer: Self-pay | Admitting: Nurse Practitioner

## 2014-12-15 ENCOUNTER — Other Ambulatory Visit (HOSPITAL_BASED_OUTPATIENT_CLINIC_OR_DEPARTMENT_OTHER): Payer: Medicare Other

## 2014-12-15 ENCOUNTER — Other Ambulatory Visit: Payer: Medicare Other

## 2014-12-15 ENCOUNTER — Encounter: Payer: Self-pay | Admitting: Nurse Practitioner

## 2014-12-15 ENCOUNTER — Ambulatory Visit (HOSPITAL_BASED_OUTPATIENT_CLINIC_OR_DEPARTMENT_OTHER): Payer: Medicare Other

## 2014-12-15 ENCOUNTER — Ambulatory Visit (HOSPITAL_BASED_OUTPATIENT_CLINIC_OR_DEPARTMENT_OTHER): Payer: Medicare Other | Admitting: Nurse Practitioner

## 2014-12-15 ENCOUNTER — Ambulatory Visit: Payer: Medicare Other

## 2014-12-15 VITALS — BP 149/77 | HR 73 | Temp 97.6°F | Resp 18 | Ht 60.0 in | Wt 102.3 lb

## 2014-12-15 DIAGNOSIS — D0511 Intraductal carcinoma in situ of right breast: Secondary | ICD-10-CM

## 2014-12-15 DIAGNOSIS — M810 Age-related osteoporosis without current pathological fracture: Secondary | ICD-10-CM

## 2014-12-15 DIAGNOSIS — M8580 Other specified disorders of bone density and structure, unspecified site: Secondary | ICD-10-CM

## 2014-12-15 DIAGNOSIS — C50411 Malignant neoplasm of upper-outer quadrant of right female breast: Secondary | ICD-10-CM

## 2014-12-15 DIAGNOSIS — C50419 Malignant neoplasm of upper-outer quadrant of unspecified female breast: Secondary | ICD-10-CM

## 2014-12-15 DIAGNOSIS — Z170 Estrogen receptor positive status [ER+]: Secondary | ICD-10-CM

## 2014-12-15 LAB — COMPREHENSIVE METABOLIC PANEL (CC13)
ALBUMIN: 3.8 g/dL (ref 3.5–5.0)
ALK PHOS: 53 U/L (ref 40–150)
ALT: 15 U/L (ref 0–55)
ANION GAP: 10 meq/L (ref 3–11)
AST: 20 U/L (ref 5–34)
BILIRUBIN TOTAL: 0.85 mg/dL (ref 0.20–1.20)
BUN: 12.8 mg/dL (ref 7.0–26.0)
CALCIUM: 8.9 mg/dL (ref 8.4–10.4)
CO2: 27 mEq/L (ref 22–29)
CREATININE: 0.9 mg/dL (ref 0.6–1.1)
Chloride: 107 mEq/L (ref 98–109)
EGFR: 60 mL/min/{1.73_m2} — ABNORMAL LOW (ref >90)
Glucose: 128 mg/dl (ref 70–140)
Potassium: 4.1 mEq/L (ref 3.5–5.1)
Sodium: 144 mEq/L (ref 136–145)
Total Protein: 7 g/dL (ref 6.4–8.3)

## 2014-12-15 LAB — CBC WITH DIFFERENTIAL/PLATELET
BASO%: 0.5 % (ref 0.0–2.0)
Basophils Absolute: 0 10*3/uL (ref 0.0–0.1)
EOS%: 6.1 % (ref 0.0–7.0)
Eosinophils Absolute: 0.4 10*3/uL (ref 0.0–0.5)
HEMATOCRIT: 45.8 % (ref 34.8–46.6)
HGB: 14.6 g/dL (ref 11.6–15.9)
LYMPH#: 0.7 10*3/uL — AB (ref 0.9–3.3)
LYMPH%: 11.3 % — ABNORMAL LOW (ref 14.0–49.7)
MCH: 30.1 pg (ref 25.1–34.0)
MCHC: 31.9 g/dL (ref 31.5–36.0)
MCV: 94.4 fL (ref 79.5–101.0)
MONO#: 0.7 10*3/uL (ref 0.1–0.9)
MONO%: 11 % (ref 0.0–14.0)
NEUT#: 4.5 10*3/uL (ref 1.5–6.5)
NEUT%: 71.1 % (ref 38.4–76.8)
Platelets: 243 10*3/uL (ref 145–400)
RBC: 4.85 10*6/uL (ref 3.70–5.45)
RDW: 14.6 % — ABNORMAL HIGH (ref 11.2–14.5)
WBC: 6.4 10*3/uL (ref 3.9–10.3)

## 2014-12-15 MED ORDER — ZOLEDRONIC ACID 4 MG/100ML IV SOLN
4.0000 mg | Freq: Once | INTRAVENOUS | Status: AC
  Administered 2014-12-15: 4 mg via INTRAVENOUS
  Filled 2014-12-15: qty 100

## 2014-12-15 MED ORDER — SODIUM CHLORIDE 0.9 % IV SOLN
Freq: Once | INTRAVENOUS | Status: AC
  Administered 2014-12-15: 12:00:00 via INTRAVENOUS

## 2014-12-15 NOTE — Patient Instructions (Signed)

## 2014-12-15 NOTE — Telephone Encounter (Signed)
per pof to sch pt appt-gave pt copy of sch °

## 2014-12-15 NOTE — Telephone Encounter (Signed)
per pof to sch pt appt-sch pt mamma-gave pt copy of sch

## 2014-12-15 NOTE — Progress Notes (Signed)
ID: [name]   DOB: October 07, 1925  MR#: [id number]  DGL#:[id number]  PCP: Antony Blackbird, MD GYN:  SU:  OTHER MD:  CHIEF COMPLAINT: right breast cancer CURRENT THERAPY: anastrozole daily, zometa yearly  BREAST CANCER HISTORY: Ms. Falkenstein palpated a mass in her right breast and brought this to her primary physician's attention. She was set up for bilateral diagnostic mammography with right ultrasonography at the breast Center 06/27/2012. There was a posterior high-density mass in the upper outer quadrant of the right breast which was palpable, mobile, and accompanied by a second mass in the right breast and an axillary mass as well. Ultrasound confirmed an oval heterogeneously hypoechoic mass with circumscribed margins in the 9:00 position of the breast measuring 2.1 cm. A second hypoechoic mass was noted closer to the nipple, measuring 1.8 cm. An enlarged lymph node with normal morphology was noted in the axilla.  Biopsy of the 2 breast masses was obtained the same day, and showed ([id number]) ductal carcinoma in situ, with foci suspicious for invasive carcinoma. There were also detached fragments of tissue. Definitive distinction between in situ and invasive tumor could not be made with this material. Tumors were strongly estrogen and progesterone receptor positive (95-100%). The patient's subsequent history is as detailed below.  INTERVAL HISTORY: [name]  returns today for followup of her right breast carcinoma, accompanied by her friend and caregiver, [name].  She has been on anastrozole since September 2013, tolerating it well with no side effects that she is aware of. Specifically she denies hot flashes, vaginal changes, or joint aches. The interval history is remarkable for right carpel tunnel release surgery. She is no longer in any pain.   REVIEW OF SYSTEMS: [name] denies fevers, chills, nausea, vomiting, or changes in bowel or bladder habit. She is eating and drinking well. She denies  shortness of breath, chest pain, cough, or palpitations. She sleeps well and her energy level is good during the day. She takes donazepil daily and her memory is stable. She has macular degeneration and her vision is changing somewhat. A detailed review of systems is otherwise negative.   PAST MEDICAL HISTORY: Past Medical History  Diagnosis Date  . Cancer     breast  . Psoriasis   . Legally blind     macular degeneration  . Hypertension   . CAD (coronary artery disease) 2007    Last cath 2007, Last echo-09/01/10 EF >60%, RV Systolic [id number] mmHg;Last Nuc 2006 no ischemia  . Pulmonary HTN     mild  . Seizure disorder        . H/O syncope     remotely, no etiology found, mild carotid disease on doppler, 0-49% bil  . Full dentures   . Wears glasses   . Lives in independent group home     independent living [place]-  . Memory deficit   . Seasonal allergies   . Hallucinations     PAST SURGICAL HISTORY: Past Surgical History  Procedure Laterality Date  . Cholecystectomy    . Spine surgery    . Appendectomy    . Tonsillectomy    . Cardiac catheterization  11/22/2005    RCA with 20-30% eccentric narrowing other vessels free of disease  . Carpal tunnel release Right 07/29/2013    Procedure: CARPAL TUNNEL RELEASE;  Surgeon: [name], MD;  Location: [facility];  Service: Orthopedics;  Laterality: Right;  . Gallbladder surgery      FAMILY HISTORY Family History  Problem  Relation Age of Onset  . Pulmonary embolism Mother 45    caused her death  . CAD Father 33    caused his death  . Stroke Father   . Leukemia Brother 60    cause of death  . CAD Brother   . CAD Brother    The patient's father died in his 65s from a myocardial infarction. Her mother also died in her 45s from the same cause. The patient had 2 brothers and 6 sisters. There is no history of breast or ovarian cancer in the immediate family.   GYNECOLOGIC HISTORY: The patient is GX P1.  First live birth age 51. She does not recall when she went through the change of life. She never took hormone replacement.   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 (She went by [name]). She is divorced, and lives in a retirement community, independent living section. Her son, [name], is a Barrister's clerk judge in [place]. The patient has 2 grandchildren, no greatgrandchildren. She is a Tourist information centre manager.   ADVANCED DIRECTIVES: The patient's son [name] is her healthcare power of attorney.  HEALTH MAINTENANCE: History  Substance Use Topics  . Smoking status: Former Smoker    Types: Cigarettes    Quit date: 11/20/1994  . Smokeless tobacco: Former Systems developer    Quit date: 11/20/1994  . Alcohol Use: No     Colonoscopy:  PAP:  Bone density: January 2015  Lipid panel:  Allergies  Allergen Reactions  . Ephedra [Ephedrine] Other (See Comments)    unknown  . Captopril Rash  . Phenobarbital Rash    Current Outpatient Prescriptions  Medication Sig Dispense Refill  . anastrozole (ARIMIDEX) 1 MG tablet TAKE 1 TABLET BY MOUTH DAILY 30 tablet 4  . donepezil (ARICEPT) 10 MG tablet Take 1 tablet (10 mg total) by mouth daily. 30 tablet 6  . loratadine (CLARITIN) 10 MG tablet Take 10 mg by mouth daily as needed for allergies.    . metoprolol (LOPRESSOR) 50 MG tablet Daily.     No current facility-administered medications for this visit.    OBJECTIVE: Elderly white woman who appears well Filed Vitals:   12/15/14 1021  BP: 149/77  Pulse: 73  Temp: 97.6 F (36.4 C)  Resp: 18     Body mass index is 19.98 kg/(m^2).    ECOG FS: 0 Filed Weights   12/15/14 1021  Weight: 102 lb 4.8 oz (46.403 kg)   Skin: warm, dry  HEENT: sclerae anicteric, conjunctivae pink, oropharynx clear. No thrush or mucositis.  Lymph Nodes: No cervical or supraclavicular lymphadenopathy  Lungs: clear to auscultation bilaterally, no rales, wheezes, or rhonci   Heart: regular rate and rhythm  Abdomen: round, soft, non tender, positive bowel sounds  Musculoskeletal: No focal spinal tenderness, no peripheral edema  Neuro: non focal, well oriented, positive affect  Breasts: firm mobile mass ~1cm located at 9:00 in right breast. Right axilla benign. Left breast unremarkable.    LAB RESULTS: Lab Results  Component Value Date   WBC 6.4 12/15/2014   NEUTROABS 4.5 12/15/2014   HGB 14.6 12/15/2014   HCT 45.8 12/15/2014   MCV 94.4 12/15/2014   PLT 243 12/15/2014       Chemistry      Component Value Date/Time   NA 144 12/15/2014 1002   NA 130* 07/07/2011 1353   K 4.1 12/15/2014 1002   K 3.4* 07/07/2011 1353   CL 106 04/24/2013  0930   CL 94* 07/07/2011 1353   CO2 27 12/15/2014 1002   CO2 31 07/07/2011 1353   BUN 12.8 12/15/2014 1002   BUN 8 07/07/2011 1353   CREATININE 0.9 12/15/2014 1002   CREATININE <0.47* 07/07/2011 1353      Component Value Date/Time   CALCIUM 8.9 12/15/2014 1002   CALCIUM 8.3* 07/07/2011 1353   ALKPHOS 53 12/15/2014 1002   ALKPHOS 63 07/07/2011 1353   AST 20 12/15/2014 1002   AST 16 07/07/2011 1353   ALT 15 12/15/2014 1002   ALT 17 07/07/2011 1353   BILITOT 0.85 12/15/2014 1002   BILITOT 0.4 07/07/2011 1353       Lab Results  Component Value Date   LABCA2 20 07/19/2012    STUDIES:  07/01/2014   CLINICAL DATA:  Followup biopsy proven right breast cancer marked with a coil shaped clip. Ribbon shaped clip was previously placed at a separate location in the right breast. The patient is currently undergoing medical therapy but has not has surgery.  EXAM: DIGITAL DIAGNOSTIC  right MAMMOGRAM WITH CAD  COMPARISON:  [facility]  ACR Breast Density Category c: The breast tissue is heterogeneously dense, which may obscure small masses.  FINDINGS: The 1.1 cm mass with internal coil shaped clip is stable in the right far lateral breast, seen only on laterally exaggerated craniocaudad view. This corresponds to the biopsy  proven breast cancer. No new abnormality is identified in the right breast.  Mammographic images were processed with CAD.  IMPRESSION: No significant change in the right breast.  RECOMMENDATION: Treatment plan  I have discussed the findings and recommendations with the patient. Results were also provided in writing at the conclusion of the visit. If applicable, a reminder letter will be sent to the patient regarding the next appointment.  BI-RADS CATEGORY  6: Known biopsy-proven malignancy.   Electronically Signed   By: Conchita Paris M.D.   On: 07/01/2014 10:59   Most recent bone density scan on 12/15/13 showed a t-score of -2.9  (osteoporosis) ASSESSMENT: 81 y.o.  woman   (1)  status post Right breast biopsy 06/27/2012 for a ductal carcinoma in situ, with possible microinvasion, strongly estrogen and progesterone receptor positive.  (2)  declined surgery, treated with anastrazole since September 2013  (3) osteoporosis documented by bone density December 2013;  receiving zoledronic acid, first dose given in January 2014, to be given yearly   PLAN:  [name] looks and feels well today. The labs were reviewed in detail and were entirely normal. She is tolerating the anastrozole well and the plan it to continue it indefinitely as she declines surgical intervention of the tumor. It is certainly no bigger on examination today. [name] will proceed to infusion to receive her 2nd yearly dose of zometa today.  [name] is next due for a mammogram in August. She will follow up with Dr. Jana Hakim later that month. She understands and agrees with this plan. She has been encouraged to call with any issues that might arise before her next visit here.   Marcelino Duster    12/15/2014

## 2014-12-21 ENCOUNTER — Other Ambulatory Visit: Payer: Self-pay | Admitting: Neurology

## 2014-12-31 ENCOUNTER — Ambulatory Visit (INDEPENDENT_AMBULATORY_CARE_PROVIDER_SITE_OTHER): Payer: 59 | Admitting: Nurse Practitioner

## 2014-12-31 ENCOUNTER — Encounter: Payer: Self-pay | Admitting: Nurse Practitioner

## 2014-12-31 VITALS — BP 130/62 | HR 72 | Ht 60.0 in | Wt 102.0 lb

## 2014-12-31 DIAGNOSIS — R413 Other amnesia: Secondary | ICD-10-CM

## 2014-12-31 DIAGNOSIS — R441 Visual hallucinations: Secondary | ICD-10-CM

## 2014-12-31 MED ORDER — DONEPEZIL HCL 10 MG PO TABS: 10.0000 mg | ORAL_TABLET | Freq: Every day | ORAL | Status: DC

## 2014-12-31 NOTE — Patient Instructions (Signed)
Continue Aricept at current dose will refill F/U in 6 months

## 2014-12-31 NOTE — Progress Notes (Signed)
[facility]  PATIENT: [name] DOB: 1925-07-29   REASON FOR VISIT: Memory loss, visual hallucinations HISTORY FROM:caregiver Lynn    HISTORY OF PRESENT ILLNESS:[name],  accompanied by her caregiver [name], returns for  evaluation of memory loss, hallucinations. She had past medical history of hypertension, hyperlipidemia, breast cancer, taking Anastrozole.  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  [name], 81 year old female returns for followup. She was last seen in this office 06/30/2014.  She has a history of memory loss. She is on  Aricept at 10 mg. She was having  more confusion and hallucinations when last seen. Seroquel was added but caused too much drowsiness. She returns today with her caregiver [name]. She continues to live in her private apartment, she has good appetite and she is sleeping well at night. She has not fallen in the last year.  She has poor vision due to macular degeneration. She denies any GI side effects to Aricept. She has not had recent hallucinations. She returns for reevaluation.    REVIEW OF SYSTEMS: Full 14 system review of systems performed and notable only for those listed, all others are neg:  Constitutional: neg  Cardiovascular: neg Ear/Nose/Throat: neg  Skin: neg Eyes: Macular degeneration Respiratory: neg Gastroitestinal: neg  Hematology/Lymphatic: neg    Endocrine: neg Musculoskeletal:neg Allergy/Immunology: neg Neurological: Memory loss Psychiatric: neg Sleep : neg   ALLERGIES: Allergies  Allergen Reactions  . Ephedra [Ephedrine] Other (See Comments)    unknown  . Captopril Rash  . Phenobarbital Rash    HOME MEDICATIONS: Outpatient Prescriptions Prior to Visit  Medication Sig Dispense Refill  . anastrozole (ARIMIDEX) 1 MG tablet TAKE 1 TABLET BY MOUTH DAILY 30 tablet 4  . donepezil (ARICEPT) 10 MG tablet TAKE 1 TABLET BY MOUTH AT BEDTIME. 30 tablet 0  . loratadine (CLARITIN) 10 MG tablet Take 10 mg by mouth daily as needed for allergies.    . metoprolol (LOPRESSOR) 50 MG tablet Daily.     No facility-administered medications prior to visit.    PAST MEDICAL HISTORY: Past Medical History  Diagnosis Date  . Cancer     breast  . Psoriasis   . Legally blind     macular degeneration  . Hypertension   . CAD (coronary artery disease) 2007    Last cath 2007, Last echo-09/01/10 EF >32%, RV Systolic [id number] mmHg;Last Nuc 2006 no ischemia  . Pulmonary HTN     mild  . Seizure disorder        . H/O syncope     remotely, no etiology found, mild carotid disease on doppler, 0-49% bil  . Full dentures   . Wears glasses   . Lives in independent group home     [facility]-  . Memory deficit   . Seasonal allergies   . Hallucinations     PAST SURGICAL HISTORY: Past Surgical History  Procedure Laterality Date  . Cholecystectomy    .  Spine surgery    . Appendectomy    . Tonsillectomy    . Cardiac catheterization  11/22/2005    RCA with 20-30% eccentric narrowing other vessels free of disease  . Carpal tunnel release Right 07/29/2013    Procedure: CARPAL TUNNEL RELEASE;  Surgeon: [name], MD;  Location: [facility];  Service: Orthopedics;  Laterality: Right;  . Gallbladder surgery      FAMILY HISTORY: Family History  Problem Relation Age of Onset  . Pulmonary embolism Mother 59     caused her death  . CAD Father 4    caused his death  . Stroke Father   . Leukemia Brother 60    cause of death  . CAD Brother   . CAD Brother     SOCIAL HISTORY: History   Social History  . Marital Status: Divorced    Spouse Name: N/A  . Number of Children: 1  . Years of Education: college   Occupational History  .      Retired - Therapist, sports   Social History Main Topics  . Smoking status: Former Smoker    Types: Cigarettes    Quit date: 11/20/1994  . Smokeless tobacco: Former Systems developer    Quit date: 11/20/1994  . Alcohol Use: No  . Drug Use: No  . Sexual Activity: No   Other Topics Concern  . Not on file   Social History Narrative   Patient lives at home alone. Patient is divorced.   Retired Therapist, sports.   College education.   Right handed.   Caffeine- two cups daily coffee.     PHYSICAL EXAM  Filed Vitals:   12/31/14 1359  BP: 130/62  Pulse: 72  Height: 5' (1.524 m)  Weight: 102 lb (46.267 kg)   Body mass index is 19.92 kg/(m^2). Generalized: In no acute distress  Neck: Supple, no carotid bruits  Cardiac: Regular rate rhythm  Pulmonary: Clear to auscultation bilaterally  Musculoskeletal: No deformity  Neurological examination  Mentation: Alert oriented to time, place, history taking, and causual conversation, Mini-Mental Status Examination is 15 out of 28, missing items in orientation calculation 3 of 3 recall. She is unable to see well enough to write a sentence or draw a figure . AFT 7. GDS=4 Cranial nerve II-XII: Pupils were equal round reactive to light extraocular movements were full, Visual field were full on confrontational test. Facial sensation and strength were normal. Hearing was intact to finger rubbing bilaterally. Uvula tongue midline. head turning and shoulder shrug and were normal and symmetric.Tongue protrusion into cheek strength was normal.  Motor: normal tone, bulk and strength. No focal weakness Coordination: Normal finger to nose,  heel-to-shin bilaterally  Gait: Rising up from seated position without assistance, normal stance, Steady gait no assistive device. Romberg signs: Negative  Deep tendon reflexes: Brachioradialis 2/2, biceps 2/2, triceps 2/2, patellar 2/2, Achilles 2/2, plantar responses were flexor bilaterally.   DIAGNOSTIC DATA (LABS, IMAGING, TESTING) - I reviewed patient records, labs, notes, testing and imaging myself where available.  Lab Results  Component Value Date   WBC 6.4 12/15/2014   HGB 14.6 12/15/2014   HCT 45.8 12/15/2014   MCV 94.4 12/15/2014   PLT 243 12/15/2014      Component Value Date/Time   NA 144 12/15/2014 1002   NA 130* 07/07/2011 1353   K 4.1 12/15/2014 1002   K 3.4* 07/07/2011 1353   CL 106 04/24/2013 0930   CL 94* 07/07/2011 1353   CO2 27 12/15/2014  1002   CO2 31 07/07/2011 1353   GLUCOSE 128 12/15/2014 1002   GLUCOSE 104* 04/24/2013 0930   GLUCOSE 143* 07/07/2011 1353   BUN 12.8 12/15/2014 1002   BUN 8 07/07/2011 1353   CREATININE 0.9 12/15/2014 1002   CREATININE <0.47* 07/07/2011 1353   CALCIUM 8.9 12/15/2014 1002   CALCIUM 8.3* 07/07/2011 1353   PROT 7.0 12/15/2014 1002   PROT 6.3 07/07/2011 1353   ALBUMIN 3.8 12/15/2014 1002   ALBUMIN 3.1* 07/07/2011 1353   AST 20 12/15/2014 1002   AST 16 07/07/2011 1353   ALT 15 12/15/2014 1002   ALT 17 07/07/2011 1353   ALKPHOS 53 12/15/2014 1002   ALKPHOS 63 07/07/2011 1353   BILITOT 0.85 12/15/2014 1002   BILITOT 0.4 07/07/2011 1353   GFRNONAA NOT CALCULATED 07/07/2011 1353   GFRAA NOT CALCULATED 07/07/2011 1353   ASSESSMENT AND PLAN  81 y.o. year old female  has a past medical history of  gradual onset of memory loss, occasional visual hallucinations. She has macular degeneration with very poor vision. Her diagnosis is most consistent with central nervous system degenerative disorder such as Alzheimer's disease.MMSE 15 out of 28, missing items in orientation calculation 3 of 3 recall. She is unable to see well  enough to write a sentence or draw a figure . AFT 7. GDS=4   Continue Aricept at current dose will refill Discussed long range planning and the decision is patient will stay at home as long as possible F/U in 6 months Dennie Bible, [facility], [facility], [facility] [address], [place], [place] [address] [phone]

## 2015-01-01 NOTE — Progress Notes (Signed)
I have reviewed and agreed above plan. 

## 2015-01-19 ENCOUNTER — Other Ambulatory Visit: Payer: Self-pay | Admitting: Oncology

## 2015-07-05 ENCOUNTER — Ambulatory Visit (INDEPENDENT_AMBULATORY_CARE_PROVIDER_SITE_OTHER): Payer: Medicare Other | Admitting: Nurse Practitioner

## 2015-07-05 ENCOUNTER — Encounter: Payer: Self-pay | Admitting: Nurse Practitioner

## 2015-07-05 VITALS — BP 146/78 | HR 92 | Ht 61.0 in | Wt 112.2 lb

## 2015-07-05 DIAGNOSIS — R413 Other amnesia: Secondary | ICD-10-CM | POA: Diagnosis not present

## 2015-07-05 DIAGNOSIS — R441 Visual hallucinations: Secondary | ICD-10-CM

## 2015-07-05 MED ORDER — DONEPEZIL HCL 10 MG PO TABS: 10.0000 mg | ORAL_TABLET | Freq: Every day | ORAL | Status: DC

## 2015-07-05 NOTE — Progress Notes (Signed)
[facility]  PATIENT: [name] DOB: 1924-12-19   REASON FOR VISIT: Follow-up for memory loss  HISTORY FROM: Patient and caregiver    HISTORY OF PRESENT ILLNESS: [name], accompanied by her caregiver [name], returns for evaluation of memory loss, hallucinations. She had past medical history of hypertension, hyperlipidemia, breast cancer, taking Anastrozole.  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 [name], 81 year old female returns for followup. She was last seen in this office 06/30/2014.  She has a history of memory loss. She is on Aricept at 10 mg. She was having more confusion and hallucinations when last seen. Seroquel was added but caused too much drowsiness. She returns today with her caregiver [name]. She continues to live in her private apartment, she has good appetite and she is sleeping well at night. She has not fallen in the last year. She has poor vision due to macular degeneration. She denies any GI side effects to Aricept. She has not had recent hallucinations. She returns for reevaluation.  UPDATE 07/05/2015 [name], 81 year old female returns for follow-up. She has a history of memory loss and is currently on Aricept 10 mg daily without side effects. She was having some hallucinations. Seroquel was started however it caused too much drowsiness. She continues to live alone in her  apartment however has a caregiver check on her at least daily to make sure she takes her medications. C/G does cooking and housekeeping for her. The caregiver tells me today that [name] will be moving to [facility] in September to be closer to her only child. Apparently he is trying to get her into a facility. She continues to have a good appetite and sleeps well at night. She has not had any falls. She has poor vision. She has recently had some wandering behavior. She returns for reevaluation    REVIEW OF SYSTEMS: Full 14 system review of systems performed and notable only for those listed, all others are neg:  Constitutional: neg  Cardiovascular: Leg swelling Ear/Nose/Throat: neg  Skin: neg Eyes: Loss of vision, macular degeneration Respiratory: neg Gastroitestinal: neg  Hematology/Lymphatic: neg  Endocrine: Intolerance to heat and cold Musculoskeletal:neg Allergy/Immunology: neg Neurological: Memory loss  Psychiatric: Confusion anxiety Sleep : neg   ALLERGIES: Allergies  Allergen Reactions  . Ephedra [Ephedrine] Other (See Comments)    unknown  . Captopril Rash  . Phenobarbital Rash    HOME MEDICATIONS: Outpatient Prescriptions Prior to Visit  Medication Sig Dispense Refill  . anastrozole (ARIMIDEX) 1 MG tablet TAKE 1 TABLET BY MOUTH DAILY 30 tablet 5  . donepezil (ARICEPT) 10 MG tablet Take 1 tablet (10 mg total) by mouth at bedtime. 30 tablet 6  . metoprolol (LOPRESSOR) 50 MG tablet Daily. Order BID, takes daily.    Marland Kitchen loratadine (CLARITIN) 10 MG tablet Take 10 mg by mouth daily as needed for allergies.     No facility-administered medications prior to visit.  PAST MEDICAL HISTORY: Past Medical History  Diagnosis Date  . Cancer     breast  . Psoriasis   . Legally blind     macular degeneration  . Hypertension   . CAD (coronary artery disease) 2007    Last cath 2007, Last echo-09/01/10 EF >61%, RV Systolic [id number] mmHg;Last Nuc 2006 no ischemia  .  Pulmonary HTN     mild  . Seizure disorder        . H/O syncope     remotely, no etiology found, mild carotid disease on doppler, 0-49% bil  . Full dentures   . Wears glasses   . Lives in independent home     i  . Memory deficit   . Seasonal allergies   . Hallucinations     PAST SURGICAL HISTORY: Past Surgical History  Procedure Laterality Date  . Cholecystectomy    . Spine surgery    . Appendectomy    . Tonsillectomy    . Cardiac catheterization  11/22/2005    RCA with 20-30% eccentric narrowing other vessels free of disease  . Carpal tunnel release Right 07/29/2013    Procedure: CARPAL TUNNEL RELEASE;  Surgeon: [name], MD;  Location: [place];  Service: Orthopedics;  Laterality: Right;  . Gallbladder surgery      FAMILY HISTORY: Family History  Problem Relation Age of Onset  . Pulmonary embolism Mother 32    caused her death  . CAD Father 87    caused his death  . Stroke Father   . Leukemia Brother 60    cause of death  . CAD Brother   . CAD Brother     SOCIAL HISTORY: Social History   Social History  . Marital Status: Divorced    Spouse Name: N/A  . Number of Children: 1  . Years of Education: college   Occupational History  .      Retired - Therapist, sports   Social History Main Topics  . Smoking status: Former Smoker    Types: Cigarettes    Quit date: 11/20/1994  . Smokeless tobacco: Former Systems developer    Quit date: 11/20/1994  . Alcohol Use: No  . Drug Use: No  . Sexual Activity: No   Other Topics Concern  . Not on file   Social History Narrative   Patient lives at home alone. Patient is divorced.   Retired Therapist, sports.   College education.   Right handed.   Caffeine- two cups daily coffee.     PHYSICAL EXAM  Filed Vitals:   07/05/15 0939  BP: 146/78  Pulse: 92  Height: 5\' 1"  (1.549 m)  Weight: 112 lb 3.2 oz (50.894 kg)   Body mass index is 21.21 kg/(m^2). Generalized: In no acute distress  Neck: Supple, no carotid bruits   Cardiac: Regular rate rhythm  Pulmonary: Clear to auscultation bilaterally  Musculoskeletal: No deformity  Neurological examination  Mentation: Alert oriented to time, place, history taking, and causual conversation, Mini-Mental Status Examination is 14 out of 28, missing items in orientation calculation 3 of 3 recall. Last 15/28 She is unable to see well enough to write a sentence or draw a figure . AFT 4 Cranial nerve II-XII: Pupils were equal round reactive to light extraocular movements were full, Visual field were full on confrontational test. Facial sensation and strength were normal. Hearing was intact to finger rubbing bilaterally. Uvula tongue midline. head turning and shoulder shrug and were normal and symmetric.Tongue protrusion into cheek strength  was normal.  Motor: normal tone, bulk and strength. No focal weakness Coordination: Normal finger to nose, heel-to-shin bilaterally  Gait: Rising up from seated position without assistance, normal stance, Steady gait no assistive device. Romberg signs: Negative  Deep tendon reflexes: Brachioradialis 2/2, biceps 2/2, triceps 2/2, patellar 2/2, Achilles 2/2, plantar responses were flexor bilaterally.   DIAGNOSTIC DATA (LABS, IMAGING, TESTING) - I reviewed patient records, labs, notes, testing and imaging myself where available.  Lab Results  Component Value Date   WBC 6.4 12/15/2014   HGB 14.6 12/15/2014   HCT 45.8 12/15/2014   MCV 94.4 12/15/2014   PLT 243 12/15/2014      Component Value Date/Time   NA 144 12/15/2014 1002   NA 130* 07/07/2011 1353   K 4.1 12/15/2014 1002   K 3.4* 07/07/2011 1353   CL 106 04/24/2013 0930   CL 94* 07/07/2011 1353   CO2 27 12/15/2014 1002   CO2 31 07/07/2011 1353   GLUCOSE 128 12/15/2014 1002   GLUCOSE 104* 04/24/2013 0930   GLUCOSE 143* 07/07/2011 1353   BUN 12.8 12/15/2014 1002   BUN 8 07/07/2011 1353   CREATININE 0.9 12/15/2014 1002   CREATININE <0.47* 07/07/2011 1353    CALCIUM 8.9 12/15/2014 1002   CALCIUM 8.3* 07/07/2011 1353   PROT 7.0 12/15/2014 1002   PROT 6.3 07/07/2011 1353   ALBUMIN 3.8 12/15/2014 1002   ALBUMIN 3.1* 07/07/2011 1353   AST 20 12/15/2014 1002   AST 16 07/07/2011 1353   ALT 15 12/15/2014 1002   ALT 17 07/07/2011 1353   ALKPHOS 53 12/15/2014 1002   ALKPHOS 63 07/07/2011 1353   BILITOT 0.85 12/15/2014 1002   BILITOT 0.4 07/07/2011 1353   GFRNONAA NOT CALCULATED 07/07/2011 1353   GFRAA NOT CALCULATED 07/07/2011 1353    ASSESSMENT AND PLAN  81 y.o. year old female  has a past medical history of Cancer;  Legally blind; Hypertension; CAD (coronary artery disease) (2007); Pulmonary HTN; Seizure disorder;  Memory deficit; here to follow up. History provided by caregiver  Continue Aricept at current dose will refill Patient is moving to [facility] no follow-up planned Dennie Bible, [facility], [facility], APRN  [facility] [address], [place], [place] [address] [phone]

## 2015-07-05 NOTE — Patient Instructions (Signed)
Continue Aricept at current dose will refill Patient is moving to [facility] no follow-up planned

## 2015-07-05 NOTE — Progress Notes (Signed)
I have reviewed and agreed above plan. 

## 2015-07-15 ENCOUNTER — Telehealth: Payer: Self-pay | Admitting: Oncology

## 2015-07-15 ENCOUNTER — Other Ambulatory Visit (HOSPITAL_BASED_OUTPATIENT_CLINIC_OR_DEPARTMENT_OTHER): Payer: Medicare Other

## 2015-07-15 ENCOUNTER — Ambulatory Visit (HOSPITAL_BASED_OUTPATIENT_CLINIC_OR_DEPARTMENT_OTHER): Payer: Medicare Other | Admitting: Oncology

## 2015-07-15 VITALS — BP 139/63 | HR 72 | Temp 98.0°F | Resp 18 | Ht 61.0 in | Wt 112.6 lb

## 2015-07-15 DIAGNOSIS — D0511 Intraductal carcinoma in situ of right breast: Secondary | ICD-10-CM

## 2015-07-15 DIAGNOSIS — Z170 Estrogen receptor positive status [ER+]: Secondary | ICD-10-CM | POA: Diagnosis not present

## 2015-07-15 DIAGNOSIS — C50919 Malignant neoplasm of unspecified site of unspecified female breast: Secondary | ICD-10-CM

## 2015-07-15 DIAGNOSIS — Z79811 Long term (current) use of aromatase inhibitors: Secondary | ICD-10-CM | POA: Diagnosis not present

## 2015-07-15 DIAGNOSIS — C50411 Malignant neoplasm of upper-outer quadrant of right female breast: Secondary | ICD-10-CM

## 2015-07-15 LAB — CBC WITH DIFFERENTIAL/PLATELET
BASO%: 1.5 % (ref 0.0–2.0)
Basophils Absolute: 0.1 10e3/uL (ref 0.0–0.1)
EOS%: 8.4 % — ABNORMAL HIGH (ref 0.0–7.0)
Eosinophils Absolute: 0.5 10e3/uL (ref 0.0–0.5)
HCT: 41.8 % (ref 34.8–46.6)
HGB: 13.6 g/dL (ref 11.6–15.9)
LYMPH%: 13.5 % — ABNORMAL LOW (ref 14.0–49.7)
MCH: 30.4 pg (ref 25.1–34.0)
MCHC: 32.5 g/dL (ref 31.5–36.0)
MCV: 93.5 fL (ref 79.5–101.0)
MONO#: 0.8 10e3/uL (ref 0.1–0.9)
MONO%: 14 % (ref 0.0–14.0)
NEUT#: 3.4 10e3/uL (ref 1.5–6.5)
NEUT%: 62.6 % (ref 38.4–76.8)
Platelets: 240 10e3/uL (ref 145–400)
RBC: 4.47 10e6/uL (ref 3.70–5.45)
RDW: 14.7 % — ABNORMAL HIGH (ref 11.2–14.5)
WBC: 5.5 10e3/uL (ref 3.9–10.3)
lymph#: 0.7 10e3/uL — ABNORMAL LOW (ref 0.9–3.3)

## 2015-07-15 LAB — COMPREHENSIVE METABOLIC PANEL (CC13)
ALBUMIN: 3.3 g/dL — AB (ref 3.5–5.0)
ALK PHOS: 57 U/L (ref 40–150)
ALT: 15 U/L (ref 0–55)
ANION GAP: 5 meq/L (ref 3–11)
AST: 19 U/L (ref 5–34)
BILIRUBIN TOTAL: 0.84 mg/dL (ref 0.20–1.20)
BUN: 13.3 mg/dL (ref 7.0–26.0)
CALCIUM: 9 mg/dL (ref 8.4–10.4)
CO2: 30 mEq/L — ABNORMAL HIGH (ref 22–29)
Chloride: 108 mEq/L (ref 98–109)
Creatinine: 0.8 mg/dL (ref 0.6–1.1)
EGFR: 65 mL/min/{1.73_m2} — AB (ref >90)
Glucose: 92 mg/dl (ref 70–140)
POTASSIUM: 4.6 meq/L (ref 3.5–5.1)
Sodium: 143 mEq/L (ref 136–145)
TOTAL PROTEIN: 6.2 g/dL — AB (ref 6.4–8.3)

## 2015-07-15 MED ORDER — ANASTROZOLE 1 MG PO TABS: 1.0000 mg | ORAL_TABLET | Freq: Every day | ORAL | Status: AC

## 2015-07-15 NOTE — Telephone Encounter (Signed)
Pt confirmed labs/ov per 08/25 POF, gave pt avs and calendar... KJ, scheduled MM

## 2015-07-15 NOTE — Progress Notes (Signed)
ID: [name]   DOB: [birth year]  MR#: [id number]  YHC#:[id number]  PCP: Antony Blackbird, MD GYN:  SU:  OTHER MD:  CHIEF COMPLAINT: right breast cancer CURRENT THERAPY: anastrozole daily, zometa yearly  BREAST CANCER HISTORY: From the original intake note: [name] palpated a mass in her right breast and brought this to her primary physician's attention. She was set up for bilateral diagnostic mammography with right ultrasonography at the [facility] Center 06/27/2012. There was a posterior high-density mass in the upper outer quadrant of the right breast which was palpable, mobile, and accompanied by a second mass in the right breast and an axillary mass as well. Ultrasound confirmed an oval heterogeneously hypoechoic mass with circumscribed margins in the 9:00 position of the breast measuring 2.1 cm. A second hypoechoic mass was noted closer to the nipple, measuring 1.8 cm. An enlarged lymph node with normal morphology was noted in the axilla.  Biopsy of the 2 breast masses was obtained the same day, and showed ([id number]) ductal carcinoma in situ, with foci suspicious for invasive carcinoma. There were also detached fragments of tissue. Definitive distinction between in situ and invasive tumor could not be made with this material. Tumors were strongly estrogen and progesterone receptor positive (95-100%).   The patient's subsequent history is as detailed below.  INTERVAL HISTORY: [name]  returns today for followup of her right breast carcinoma, accompanied by her caregiver, [name]. [name] continues on anastrozole and neither she nor her caregiver report any side effects from that medication that they are aware of. She is obtaining it at a very low Price.  REVIEW OF SYSTEMS: [name] remains very forgetful but she is not particularly disturbed by this. She tells me she does some house work although her caregiver shakes her head indicating that she really does not do all that much except listened to the  TV which she cannot see very well because of macular degeneration. The patient has a little bit of a runny nose and she is taking [name] for that. Occasionally her ankles swell. The psoriasis particular in her feet does bother her. Sometimes she may be anxious, but she certainly was not anxious during our visit here. A detailed review of systems was otherwise stable   PAST MEDICAL HISTORY: Past Medical History  Diagnosis Date  . Cancer     breast  . Psoriasis   . Legally blind     macular degeneration  . Hypertension   . CAD (coronary artery disease) 2007    Last cath 2007, Last echo-09/01/10 EF >73%, RV Systolic [id number] mmHg;Last Nuc 2006 no ischemia  . Pulmonary HTN     mild  . Seizure disorder        . H/O syncope     remotely, no etiology found, mild carotid disease on doppler, 0-49% bil  . Full dentures   . Wears glasses   . Lives in independent group home     [facility] living [facility]-  . Memory deficit   . Seasonal allergies   . Hallucinations   . Cataracts, bilateral   . Macular degeneration     PAST SURGICAL HISTORY: Past Surgical History  Procedure Laterality Date  . Cholecystectomy    . Spine surgery    . Appendectomy    . Tonsillectomy    . Cardiac catheterization  11/22/2005    RCA with 20-30% eccentric narrowing other vessels free of disease  . Carpal tunnel release Right 07/29/2013    Procedure: CARPAL TUNNEL RELEASE;  Surgeon: [name], MD;  Location: [facility];  Service: Orthopedics;  Laterality: Right;  . Gallbladder surgery      FAMILY HISTORY Family History  Problem Relation Age of Onset  . Pulmonary embolism Mother 3    caused her death  . CAD Father 24    caused his death  . Stroke Father   . Leukemia Brother 60    cause of death  . CAD Brother   . CAD Brother    The patient's father died in his 41s from a myocardial infarction. Her mother also died in her 45s from the same cause. The patient had 2 brothers and  6 sisters. There is no history of breast or ovarian cancer in the immediate family.   GYNECOLOGIC HISTORY: The patient is GX P1. First live birth age 81. She does not recall when she went through the change of life. She never took hormone replacement.   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 (She went by [name]). She is divorced, and lives in a retirement community, independent living section. Her son, [name], is a Barrister's clerk judge in [place]. The patient has 2 grandchildren, no greatgrandchildren. She is a Tourist information centre manager.   ADVANCED DIRECTIVES: The patient's son [name] is her healthcare power of attorney.  HEALTH MAINTENANCE: Social History  Substance Use Topics  . Smoking status: Former Smoker    Types: Cigarettes    Quit date: 11/20/1994  . Smokeless tobacco: Former Systems developer    Quit date: 11/20/1994  . Alcohol Use: No     Colonoscopy:  PAP:  Bone density: January 2015  Lipid panel:  Allergies  Allergen Reactions  . Ephedra [Ephedrine] Other (See Comments)    unknown  . Captopril Rash  . Phenobarbital Rash    Current Outpatient Prescriptions  Medication Sig Dispense Refill  . anastrozole (ARIMIDEX) 1 MG tablet TAKE 1 TABLET BY MOUTH DAILY 30 tablet 5  . donepezil (ARICEPT) 10 MG tablet Take 1 tablet (10 mg total) by mouth at bedtime. 30 tablet 6  . metoprolol (LOPRESSOR) 50 MG tablet Daily. Order BID, takes daily.     No current facility-administered medications for this visit.    OBJECTIVE: Elderly white woman in no acute distress Filed Vitals:   07/15/15 1245  BP: 139/63  Pulse: 72  Temp: 98 F (36.7 C)  Resp: 18     Body mass index is 21.29 kg/(m^2).    ECOG FS: 0 Filed Weights   07/15/15 1245  Weight: 112 lb 9.6 oz (51.075 kg)   Sclerae unicteric, EOMs intact Oropharynx clear and moist No cervical or supraclavicular adenopathy Lungs no rales or rhonchi Heart regular rate and rhythm Abd  soft, nontender, positive bowel sounds MSK no focal spinal tenderness, no upper extremity lymphedema Neuro: nonfocal, appropriate, jovial affect Breasts:    LAB RESULTS: Lab Results  Component Value Date   WBC 5.5 07/15/2015   NEUTROABS 3.4 07/15/2015   HGB 13.6 07/15/2015   HCT 41.8 07/15/2015   MCV 93.5 07/15/2015   PLT 240 07/15/2015       Chemistry      Component Value Date/Time   NA 143 07/15/2015 1234   NA 130* 07/07/2011 1353   K 4.6 07/15/2015 1234   K 3.4* 07/07/2011 1353   CL 106 04/24/2013 0930   CL 94* 07/07/2011 1353   CO2 30* 07/15/2015 1234   CO2 31 07/07/2011 1353  BUN 13.3 07/15/2015 1234   BUN 8 07/07/2011 1353   CREATININE 0.8 07/15/2015 1234   CREATININE <0.47* 07/07/2011 1353      Component Value Date/Time   CALCIUM 9.0 07/15/2015 1234   CALCIUM 8.3* 07/07/2011 1353   ALKPHOS 57 07/15/2015 1234   ALKPHOS 63 07/07/2011 1353   AST 19 07/15/2015 1234   AST 16 07/07/2011 1353   ALT 15 07/15/2015 1234   ALT 17 07/07/2011 1353   BILITOT 0.84 07/15/2015 1234   BILITOT 0.4 07/07/2011 1353       Lab Results  Component Value Date   LABCA2 20 07/19/2012    STUDIES: No results found.  Most recent bone density scan on 12/15/13 showed a t-score of -2.9  (osteoporosis) ASSESSMENT: 81 y.o. [place] woman   (1)  status post Right breast biopsy 06/27/2012 for a ductal carcinoma in situ, with possible microinvasion, strongly estrogen and progesterone receptor positive.  (2)  declined surgery, treated with anastrazole since September 2013  (3) osteoporosis documented by bone density December 2013;  receiving zoledronic acid, first dose given in January 2014, to be given yearly  (a) bone density 12/15/2013 showed a T score of -2.9   PLAN:  [name] looks terrific. She maintains an excellent positive attitude. She is tolerating the anastrozole with no side effects that we are aware of.   She is now 3 years out from her initial diagnosis with no  evidence of disease progression. This is very favorable.   She was supposed to have had a mammogram prior to this visit but for some reason that does not happen. I am trying to arrange that for some time next week.  We are going to continue the anastrozole indefinitely and she will receive zolendronate once a year for the next 2 years after which we will discontinue that medication.  At this point I'm comfortable seeing her on a once a year basis. She will see [place] right after her mammogram. She can have the zolendronate at on the same date as her visit.  Sacheen Arrasmith C    07/15/2015

## 2015-07-29 ENCOUNTER — Ambulatory Visit
Admission: RE | Admit: 2015-07-29 | Discharge: 2015-07-29 | Disposition: A | Discharge status: Home / Self Care | Payer: Medicare Other | Source: Ambulatory Visit | Attending: Oncology | Admitting: Oncology

## 2015-07-29 DIAGNOSIS — C50919 Malignant neoplasm of unspecified site of unspecified female breast: Secondary | ICD-10-CM

## 2015-09-14 ENCOUNTER — Encounter (HOSPITAL_COMMUNITY)
Admission: EM | Disposition: A | Discharge status: Skilled Nursing Facility | Payer: Self-pay | Source: Home / Self Care | Attending: Internal Medicine

## 2015-09-14 ENCOUNTER — Emergency Department (HOSPITAL_COMMUNITY): Payer: Medicare Other

## 2015-09-14 ENCOUNTER — Inpatient Hospital Stay (HOSPITAL_COMMUNITY): Payer: Medicare Other

## 2015-09-14 ENCOUNTER — Inpatient Hospital Stay (HOSPITAL_COMMUNITY)
Admission: EM | Admit: 2015-09-14 | Discharge: 2015-09-17 | DRG: 956 | Disposition: A | Discharge status: Skilled Nursing Facility | Payer: Medicare Other | Attending: Internal Medicine | Admitting: Internal Medicine

## 2015-09-14 ENCOUNTER — Encounter (HOSPITAL_COMMUNITY): Payer: Self-pay | Admitting: *Deleted

## 2015-09-14 ENCOUNTER — Inpatient Hospital Stay (HOSPITAL_COMMUNITY): Payer: Medicare Other | Admitting: Anesthesiology

## 2015-09-14 DIAGNOSIS — R7989 Other specified abnormal findings of blood chemistry: Secondary | ICD-10-CM | POA: Diagnosis present

## 2015-09-14 DIAGNOSIS — E872 Acidosis, unspecified: Secondary | ICD-10-CM

## 2015-09-14 DIAGNOSIS — H548 Legal blindness, as defined in USA: Secondary | ICD-10-CM | POA: Diagnosis present

## 2015-09-14 DIAGNOSIS — R414 Neurologic neglect syndrome: Secondary | ICD-10-CM | POA: Diagnosis present

## 2015-09-14 DIAGNOSIS — S72142A Displaced intertrochanteric fracture of left femur, initial encounter for closed fracture: Principal | ICD-10-CM | POA: Diagnosis present

## 2015-09-14 DIAGNOSIS — W19XXXA Unspecified fall, initial encounter: Secondary | ICD-10-CM | POA: Diagnosis present

## 2015-09-14 DIAGNOSIS — J302 Other seasonal allergic rhinitis: Secondary | ICD-10-CM | POA: Diagnosis present

## 2015-09-14 DIAGNOSIS — R1310 Dysphagia, unspecified: Secondary | ICD-10-CM | POA: Diagnosis present

## 2015-09-14 DIAGNOSIS — M25552 Pain in left hip: Secondary | ICD-10-CM | POA: Diagnosis present

## 2015-09-14 DIAGNOSIS — Z823 Family history of stroke: Secondary | ICD-10-CM | POA: Diagnosis not present

## 2015-09-14 DIAGNOSIS — I639 Cerebral infarction, unspecified: Secondary | ICD-10-CM

## 2015-09-14 DIAGNOSIS — T796XXA Traumatic ischemia of muscle, initial encounter: Secondary | ICD-10-CM | POA: Diagnosis not present

## 2015-09-14 DIAGNOSIS — I272 Other secondary pulmonary hypertension: Secondary | ICD-10-CM | POA: Diagnosis present

## 2015-09-14 DIAGNOSIS — Z79811 Long term (current) use of aromatase inhibitors: Secondary | ICD-10-CM

## 2015-09-14 DIAGNOSIS — I615 Nontraumatic intracerebral hemorrhage, intraventricular: Secondary | ICD-10-CM | POA: Diagnosis not present

## 2015-09-14 DIAGNOSIS — E785 Hyperlipidemia, unspecified: Secondary | ICD-10-CM | POA: Diagnosis present

## 2015-09-14 DIAGNOSIS — D62 Acute posthemorrhagic anemia: Secondary | ICD-10-CM | POA: Diagnosis not present

## 2015-09-14 DIAGNOSIS — C50411 Malignant neoplasm of upper-outer quadrant of right female breast: Secondary | ICD-10-CM | POA: Diagnosis present

## 2015-09-14 DIAGNOSIS — I10 Essential (primary) hypertension: Secondary | ICD-10-CM | POA: Diagnosis present

## 2015-09-14 DIAGNOSIS — R410 Disorientation, unspecified: Secondary | ICD-10-CM | POA: Diagnosis not present

## 2015-09-14 DIAGNOSIS — M8580 Other specified disorders of bone density and structure, unspecified site: Secondary | ICD-10-CM | POA: Diagnosis present

## 2015-09-14 DIAGNOSIS — G40909 Epilepsy, unspecified, not intractable, without status epilepticus: Secondary | ICD-10-CM | POA: Diagnosis present

## 2015-09-14 DIAGNOSIS — I2510 Atherosclerotic heart disease of native coronary artery without angina pectoris: Secondary | ICD-10-CM | POA: Diagnosis present

## 2015-09-14 DIAGNOSIS — H3530 Unspecified macular degeneration: Secondary | ICD-10-CM | POA: Diagnosis present

## 2015-09-14 DIAGNOSIS — Z8249 Family history of ischemic heart disease and other diseases of the circulatory system: Secondary | ICD-10-CM | POA: Diagnosis not present

## 2015-09-14 DIAGNOSIS — F05 Delirium due to known physiological condition: Secondary | ICD-10-CM | POA: Diagnosis not present

## 2015-09-14 DIAGNOSIS — F0391 Unspecified dementia with behavioral disturbance: Secondary | ICD-10-CM | POA: Diagnosis present

## 2015-09-14 DIAGNOSIS — T68XXXA Hypothermia, initial encounter: Secondary | ICD-10-CM | POA: Diagnosis present

## 2015-09-14 DIAGNOSIS — Z888 Allergy status to other drugs, medicaments and biological substances status: Secondary | ICD-10-CM

## 2015-09-14 DIAGNOSIS — F22 Delusional disorders: Secondary | ICD-10-CM | POA: Diagnosis present

## 2015-09-14 DIAGNOSIS — M6282 Rhabdomyolysis: Secondary | ICD-10-CM | POA: Diagnosis present

## 2015-09-14 DIAGNOSIS — R413 Other amnesia: Secondary | ICD-10-CM | POA: Diagnosis not present

## 2015-09-14 DIAGNOSIS — I4581 Long QT syndrome: Secondary | ICD-10-CM | POA: Diagnosis present

## 2015-09-14 DIAGNOSIS — I447 Left bundle-branch block, unspecified: Secondary | ICD-10-CM | POA: Diagnosis present

## 2015-09-14 DIAGNOSIS — I6789 Other cerebrovascular disease: Secondary | ICD-10-CM | POA: Diagnosis not present

## 2015-09-14 DIAGNOSIS — Z87891 Personal history of nicotine dependence: Secondary | ICD-10-CM | POA: Diagnosis not present

## 2015-09-14 DIAGNOSIS — R441 Visual hallucinations: Secondary | ICD-10-CM

## 2015-09-14 DIAGNOSIS — Z09 Encounter for follow-up examination after completed treatment for conditions other than malignant neoplasm: Secondary | ICD-10-CM

## 2015-09-14 DIAGNOSIS — S72002A Fracture of unspecified part of neck of left femur, initial encounter for closed fracture: Secondary | ICD-10-CM

## 2015-09-14 DIAGNOSIS — S72109A Unspecified trochanteric fracture of unspecified femur, initial encounter for closed fracture: Secondary | ICD-10-CM | POA: Diagnosis present

## 2015-09-14 DIAGNOSIS — Z66 Do not resuscitate: Secondary | ICD-10-CM | POA: Diagnosis present

## 2015-09-14 DIAGNOSIS — F03918 Unspecified dementia, unspecified severity, with other behavioral disturbance: Secondary | ICD-10-CM | POA: Insufficient documentation

## 2015-09-14 DIAGNOSIS — S06360A Traumatic hemorrhage of cerebrum, unspecified, without loss of consciousness, initial encounter: Secondary | ICD-10-CM | POA: Diagnosis present

## 2015-09-14 DIAGNOSIS — Z419 Encounter for procedure for purposes other than remedying health state, unspecified: Secondary | ICD-10-CM

## 2015-09-14 DIAGNOSIS — R569 Unspecified convulsions: Secondary | ICD-10-CM | POA: Diagnosis not present

## 2015-09-14 HISTORY — PX: INTRAMEDULLARY (IM) NAIL INTERTROCHANTERIC: SHX5875

## 2015-09-14 LAB — URINE MICROSCOPIC-ADD ON

## 2015-09-14 LAB — CBC WITH DIFFERENTIAL/PLATELET
BASOS ABS: 0 10*3/uL (ref 0.0–0.1)
Basophils Relative: 0 %
Eosinophils Absolute: 0 10*3/uL (ref 0.0–0.7)
Eosinophils Relative: 0 %
HCT: 39.5 % (ref 36.0–46.0)
HEMOGLOBIN: 12.9 g/dL (ref 12.0–15.0)
LYMPHS ABS: 0.9 10*3/uL (ref 0.7–4.0)
LYMPHS PCT: 6 %
MCH: 29.9 pg (ref 26.0–34.0)
MCHC: 32.7 g/dL (ref 30.0–36.0)
MCV: 91.4 fL (ref 78.0–100.0)
Monocytes Absolute: 0.3 10*3/uL (ref 0.1–1.0)
Monocytes Relative: 2 %
NEUTROS ABS: 13.6 10*3/uL — AB (ref 1.7–7.7)
NEUTROS PCT: 92 %
PLATELETS: 252 10*3/uL (ref 150–400)
RBC: 4.32 MIL/uL (ref 3.87–5.11)
RDW: 14.6 % (ref 11.5–15.5)
WBC: 14.8 10*3/uL — AB (ref 4.0–10.5)

## 2015-09-14 LAB — URINALYSIS, ROUTINE W REFLEX MICROSCOPIC
BILIRUBIN URINE: NEGATIVE
Bilirubin Urine: NEGATIVE
GLUCOSE, UA: NEGATIVE mg/dL
Glucose, UA: NEGATIVE mg/dL
Ketones, ur: 40 mg/dL — AB
Ketones, ur: 40 mg/dL — AB
LEUKOCYTES UA: NEGATIVE
Leukocytes, UA: NEGATIVE
Nitrite: NEGATIVE
Nitrite: NEGATIVE
PH: 6 (ref 5.0–8.0)
Protein, ur: NEGATIVE mg/dL
Protein, ur: NEGATIVE mg/dL
SPECIFIC GRAVITY, URINE: 1.022 (ref 1.005–1.030)
Specific Gravity, Urine: 1.021 (ref 1.005–1.030)
UROBILINOGEN UA: 0.2 mg/dL (ref 0.0–1.0)
Urobilinogen, UA: 0.2 mg/dL (ref 0.0–1.0)
pH: 6 (ref 5.0–8.0)

## 2015-09-14 LAB — ABO/RH: ABO/RH(D): A POS

## 2015-09-14 LAB — I-STAT CG4 LACTIC ACID, ED
LACTIC ACID, VENOUS: 0.9 mmol/L (ref 0.5–2.0)
Lactic Acid, Venous: 3.24 mmol/L (ref 0.5–2.0)

## 2015-09-14 LAB — COMPREHENSIVE METABOLIC PANEL
ALK PHOS: 69 U/L (ref 38–126)
ALT: 16 U/L (ref 14–54)
AST: 36 U/L (ref 15–41)
Albumin: 3.3 g/dL — ABNORMAL LOW (ref 3.5–5.0)
Anion gap: 11 (ref 5–15)
BUN: 17 mg/dL (ref 6–20)
CALCIUM: 8.5 mg/dL — AB (ref 8.9–10.3)
CHLORIDE: 103 mmol/L (ref 101–111)
CO2: 25 mmol/L (ref 22–32)
CREATININE: 0.94 mg/dL (ref 0.44–1.00)
GFR, EST NON AFRICAN AMERICAN: 53 mL/min — AB (ref >60)
Glucose, Bld: 149 mg/dL — ABNORMAL HIGH (ref 65–99)
Potassium: 4 mmol/L (ref 3.5–5.1)
Sodium: 139 mmol/L (ref 135–145)
Total Bilirubin: 1.3 mg/dL — ABNORMAL HIGH (ref 0.3–1.2)
Total Protein: 6.4 g/dL — ABNORMAL LOW (ref 6.5–8.1)

## 2015-09-14 LAB — TYPE AND SCREEN
ABO/RH(D): A POS
Antibody Screen: NEGATIVE

## 2015-09-14 LAB — CK
CK TOTAL: 302 U/L — AB (ref 38–234)
CK TOTAL: 1993 U/L — AB (ref 38–234)

## 2015-09-14 LAB — SALICYLATE LEVEL: Salicylate Lvl: <4 mg/dL (ref 2.8–30.0)

## 2015-09-14 LAB — ACETAMINOPHEN LEVEL

## 2015-09-14 SURGERY — FIXATION, FRACTURE, INTERTROCHANTERIC, WITH INTRAMEDULLARY ROD
Anesthesia: Spinal | Site: Hip | Laterality: Left

## 2015-09-14 MED ORDER — ONDANSETRON HCL 4 MG/2ML IJ SOLN
4.0000 mg | Freq: Four times a day (QID) | INTRAMUSCULAR | Status: DC | PRN
Start: 2015-09-14 — End: 2015-09-15

## 2015-09-14 MED ORDER — FENTANYL CITRATE (PF) 100 MCG/2ML IJ SOLN
25.0000 ug | INTRAMUSCULAR | Status: DC | PRN
  Administered 2015-09-14: 50 ug via INTRAVENOUS

## 2015-09-14 MED ORDER — LACTATED RINGERS IV SOLN
INTRAVENOUS | Status: DC
  Administered 2015-09-14: 14:00:00 via INTRAVENOUS

## 2015-09-14 MED ORDER — METOPROLOL TARTRATE 50 MG PO TABS
50.0000 mg | ORAL_TABLET | Freq: Two times a day (BID) | ORAL | Status: DC
  Administered 2015-09-15 – 2015-09-17 (×5): 50 mg via ORAL
  Filled 2015-09-14 (×7): qty 1

## 2015-09-14 MED ORDER — HYDROCODONE-ACETAMINOPHEN 5-325 MG PO TABS: 1.0000 | ORAL_TABLET | Freq: Four times a day (QID) | ORAL | Status: DC | PRN

## 2015-09-14 MED ORDER — ONDANSETRON HCL 4 MG PO TABS
4.0000 mg | ORAL_TABLET | Freq: Four times a day (QID) | ORAL | Status: DC | PRN
Start: 2015-09-14 — End: 2015-09-15

## 2015-09-14 MED ORDER — MORPHINE SULFATE (PF) 2 MG/ML IV SOLN: 1.0000 mg | Freq: Once | INTRAVENOUS | Status: AC

## 2015-09-14 MED ORDER — ANASTROZOLE 1 MG PO TABS
1.0000 mg | ORAL_TABLET | Freq: Every day | ORAL | Status: DC
  Administered 2015-09-15 – 2015-09-17 (×3): 1 mg via ORAL
  Filled 2015-09-14 (×4): qty 1

## 2015-09-14 MED ORDER — CEFAZOLIN SODIUM-DEXTROSE 2-3 GM-% IV SOLR
2.0000 g | Freq: Four times a day (QID) | INTRAVENOUS | Status: AC
  Administered 2015-09-14 – 2015-09-15 (×2): 2 g via INTRAVENOUS
  Filled 2015-09-14 (×2): qty 50

## 2015-09-14 MED ORDER — FENTANYL CITRATE (PF) 100 MCG/2ML IJ SOLN
50.0000 ug | Freq: Once | INTRAMUSCULAR | Status: AC
Start: 2015-09-14 — End: 2015-09-14
  Administered 2015-09-14: 50 ug via INTRAVENOUS
  Filled 2015-09-14: qty 2

## 2015-09-14 MED ORDER — 0.9 % SODIUM CHLORIDE (POUR BTL) OPTIME
TOPICAL | Status: DC | PRN
  Administered 2015-09-14: 1000 mL

## 2015-09-14 MED ORDER — ACETAMINOPHEN 325 MG PO TABS: 650.0000 mg | ORAL_TABLET | Freq: Four times a day (QID) | ORAL | Status: DC | PRN

## 2015-09-14 MED ORDER — BUPIVACAINE HCL (PF) 0.5 % IJ SOLN
INTRAMUSCULAR | Status: DC | PRN
  Administered 2015-09-14: 3 mL via INTRATHECAL

## 2015-09-14 MED ORDER — FENTANYL CITRATE (PF) 250 MCG/5ML IJ SOLN
INTRAMUSCULAR | Status: AC
  Filled 2015-09-14: qty 5

## 2015-09-14 MED ORDER — METOCLOPRAMIDE HCL 5 MG PO TABS: 5.0000 mg | ORAL_TABLET | Freq: Three times a day (TID) | ORAL | Status: DC | PRN

## 2015-09-14 MED ORDER — ONDANSETRON HCL 4 MG/2ML IJ SOLN
4.0000 mg | Freq: Once | INTRAMUSCULAR | Status: AC
  Administered 2015-09-14: 4 mg via INTRAVENOUS
  Filled 2015-09-14: qty 2

## 2015-09-14 MED ORDER — SODIUM CHLORIDE 0.9 % IV SOLN
INTRAVENOUS | Status: DC
  Administered 2015-09-14 – 2015-09-15 (×2): via INTRAVENOUS

## 2015-09-14 MED ORDER — MORPHINE SULFATE (PF) 2 MG/ML IV SOLN: 0.5000 mg | INTRAVENOUS | Status: DC | PRN

## 2015-09-14 MED ORDER — PROPOFOL 10 MG/ML IV BOLUS
INTRAVENOUS | Status: DC | PRN
Start: 2015-09-14 — End: 2015-09-14
  Administered 2015-09-14: 20 mg via INTRAVENOUS

## 2015-09-14 MED ORDER — METOCLOPRAMIDE HCL 5 MG/ML IJ SOLN
5.0000 mg | Freq: Three times a day (TID) | INTRAMUSCULAR | Status: DC | PRN
Start: 2015-09-14 — End: 2015-09-17

## 2015-09-14 MED ORDER — STROKE: EARLY STAGES OF RECOVERY BOOK
Freq: Once | Status: DC
  Filled 2015-09-14: qty 1

## 2015-09-14 MED ORDER — MENTHOL 3 MG MT LOZG: 1.0000 | LOZENGE | OROMUCOSAL | Status: DC | PRN

## 2015-09-14 MED ORDER — SODIUM CHLORIDE 0.9 % IV BOLUS (SEPSIS)
1000.0000 mL | Freq: Once | INTRAVENOUS | Status: AC
  Administered 2015-09-14: 1000 mL via INTRAVENOUS

## 2015-09-14 MED ORDER — METOPROLOL TARTRATE 1 MG/ML IV SOLN: 5.0000 mg | Freq: Once | INTRAVENOUS | Status: AC

## 2015-09-14 MED ORDER — CEFAZOLIN SODIUM-DEXTROSE 2-3 GM-% IV SOLR
INTRAVENOUS | Status: DC | PRN
  Administered 2015-09-14: 2 g via INTRAVENOUS

## 2015-09-14 MED ORDER — DONEPEZIL HCL 10 MG PO TABS
10.0000 mg | ORAL_TABLET | Freq: Every day | ORAL | Status: DC
  Administered 2015-09-15 – 2015-09-16 (×2): 10 mg via ORAL
  Filled 2015-09-14 (×3): qty 1

## 2015-09-14 MED ORDER — PHENOL 1.4 % MT LIQD
1.0000 | OROMUCOSAL | Status: DC | PRN
Start: 2015-09-14 — End: 2015-09-17

## 2015-09-14 MED ORDER — PHENYLEPHRINE HCL 10 MG/ML IJ SOLN
10.0000 mg | INTRAVENOUS | Status: DC | PRN
  Administered 2015-09-14: 10 ug/min via INTRAVENOUS

## 2015-09-14 MED ORDER — SODIUM CHLORIDE 0.9 % IV SOLN: INTRAVENOUS | Status: DC

## 2015-09-14 MED ORDER — MORPHINE SULFATE (PF) 2 MG/ML IV SOLN
0.5000 mg | INTRAVENOUS | Status: DC | PRN
  Administered 2015-09-15: 0.5 mg via INTRAVENOUS
  Filled 2015-09-14: qty 1

## 2015-09-14 MED ORDER — MEPERIDINE HCL 25 MG/ML IJ SOLN: 6.2500 mg | INTRAMUSCULAR | Status: DC | PRN

## 2015-09-14 MED ORDER — FENTANYL CITRATE (PF) 100 MCG/2ML IJ SOLN: 25.0000 ug | INTRAMUSCULAR | Status: DC | PRN

## 2015-09-14 MED ORDER — PROPOFOL 10 MG/ML IV BOLUS
INTRAVENOUS | Status: AC
  Filled 2015-09-14: qty 20

## 2015-09-14 MED ORDER — PROMETHAZINE HCL 25 MG/ML IJ SOLN: 6.2500 mg | INTRAMUSCULAR | Status: DC | PRN

## 2015-09-14 MED ORDER — ACETAMINOPHEN 650 MG RE SUPP: 650.0000 mg | Freq: Four times a day (QID) | RECTAL | Status: DC | PRN

## 2015-09-14 MED ORDER — PROPOFOL 500 MG/50ML IV EMUL
INTRAVENOUS | Status: DC | PRN
  Administered 2015-09-14: 50 ug/kg/min via INTRAVENOUS

## 2015-09-14 SURGICAL SUPPLY — 49 items
ADH SKN CLS APL DERMABOND .7 (GAUZE/BANDAGES/DRESSINGS) ×3
ALCOHOL ISOPROPYL (RUBBING) (MISCELLANEOUS) ×3 IMPLANT
BLADE HELICAL TFNA 95 STRL (Anchor) ×1 IMPLANT
BLADE HELICAL TFNA 95MM STRL (Anchor) ×1 IMPLANT
BNDG COHESIVE 4X5 TAN STRL (GAUZE/BANDAGES/DRESSINGS) IMPLANT
CHLORAPREP W/TINT 26ML (MISCELLANEOUS) ×3 IMPLANT
COVER PERINEAL POST (MISCELLANEOUS) ×3 IMPLANT
COVER SURGICAL LIGHT HANDLE (MISCELLANEOUS) ×3 IMPLANT
DERMABOND ADVANCED (GAUZE/BANDAGES/DRESSINGS) ×6
DERMABOND ADVANCED .7 DNX12 (GAUZE/BANDAGES/DRESSINGS) ×1 IMPLANT
DRAPE C-ARM 42X72 X-RAY (DRAPES) ×3 IMPLANT
DRAPE C-ARMOR (DRAPES) ×3 IMPLANT
DRAPE IMP U-DRAPE 54X76 (DRAPES) ×6 IMPLANT
DRAPE STERI IOBAN 125X83 (DRAPES) ×3 IMPLANT
DRAPE U-SHAPE 47X51 STRL (DRAPES) ×6 IMPLANT
DRAPE UNIVERSAL PACK (DRAPES) ×3 IMPLANT
DRESSING ALLEVYN LIFE SACRUM (GAUZE/BANDAGES/DRESSINGS) ×2 IMPLANT
DRSG MEPILEX BORDER 4X4 (GAUZE/BANDAGES/DRESSINGS) ×8 IMPLANT
DRSG PAD ABDOMINAL 8X10 ST (GAUZE/BANDAGES/DRESSINGS) IMPLANT
ELECT REM PT RETURN 9FT ADLT (ELECTROSURGICAL) ×3
ELECTRODE REM PT RTRN 9FT ADLT (ELECTROSURGICAL) ×1 IMPLANT
FACESHIELD WRAPAROUND (MASK) IMPLANT
FACESHIELD WRAPAROUND OR TEAM (MASK) ×1 IMPLANT
GLOVE BIOGEL PI IND STRL 6.5 (GLOVE) IMPLANT
GLOVE BIOGEL PI IND STRL 8.5 (GLOVE) ×2 IMPLANT
GLOVE BIOGEL PI INDICATOR 6.5 (GLOVE) ×2
GLOVE BIOGEL PI INDICATOR 8.5 (GLOVE) ×2
GLOVE SS N UNI LF 6.5 STRL (GLOVE) ×2 IMPLANT
GLOVE SURG ORTHO 8.5 STRL (GLOVE) ×3 IMPLANT
GOWN STRL REUS W/ TWL LRG LVL3 (GOWN DISPOSABLE) ×2 IMPLANT
GOWN STRL REUS W/TWL 2XL LVL3 (GOWN DISPOSABLE) ×3 IMPLANT
GOWN STRL REUS W/TWL LRG LVL3 (GOWN DISPOSABLE) ×3
KIT ROOM TURNOVER OR (KITS) ×3 IMPLANT
MANIFOLD NEPTUNE II (INSTRUMENTS) ×3 IMPLANT
NAIL CANN TFNA 11X130X380 LEFT (Nail) ×2 IMPLANT
NS IRRIG 1000ML POUR BTL (IV SOLUTION) ×3 IMPLANT
PACK GENERAL/GYN (CUSTOM PROCEDURE TRAY) ×3 IMPLANT
PAD ARMBOARD 7.5X6 YLW CONV (MISCELLANEOUS) ×6 IMPLANT
PADDING CAST ABS 4INX4YD NS (CAST SUPPLIES)
PADDING CAST ABS COTTON 4X4 ST (CAST SUPPLIES) IMPLANT
REAMER ROD DEEP FLUTE 2.5X950 (INSTRUMENTS) ×2 IMPLANT
SCREW LOCKING 5.0X34MM (Screw) ×2 IMPLANT
SUT MNCRL AB 3-0 PS2 27 (SUTURE) ×3 IMPLANT
SUT MON AB 2-0 CT1 27 (SUTURE) ×1 IMPLANT
SUT MON AB 2-0 CT1 36 (SUTURE) ×4 IMPLANT
SUT VIC AB 1 CT1 27 (SUTURE) ×3
SUT VIC AB 1 CT1 27XBRD ANBCTR (SUTURE) ×1 IMPLANT
TOWEL OR 17X24 6PK STRL BLUE (TOWEL DISPOSABLE) ×3 IMPLANT
TOWEL OR 17X26 10 PK STRL BLUE (TOWEL DISPOSABLE) ×3 IMPLANT

## 2015-09-14 NOTE — Brief Op Note (Signed)
09/14/2015  4:14 PM  PATIENT:  Rebeca Allegra  81 y.o. female  PRE-OPERATIVE DIAGNOSIS:  Left Femoral Intertrochanteric Fracture  POST-OPERATIVE DIAGNOSIS:  Left Femoral Intertrochanteric Fracture  PROCEDURE:  Procedure(s): INTRAMEDULLARY (IM) NAIL INTERTROCHANTRIC (Left)  SURGEON:  Surgeon(s) and Role:    * Rod Can, MD - Primary  PHYSICIAN ASSISTANT: none  ASSISTANTS: Ky Barban, RNFA.   ANESTHESIA:   spinal  EBL:  Total I/O In: 1000 [I.V.:1000] Out: 100 [Urine:100]  BLOOD ADMINISTERED:none  DRAINS: none   LOCAL MEDICATIONS USED:  NONE  SPECIMEN:  No Specimen  DISPOSITION OF SPECIMEN:  N/A  COUNTS:  YES  TOURNIQUET:  * No tourniquets in log *  DICTATION: .Other Dictation: Dictation Number [id number]  PLAN OF CARE: Admit to inpatient   PATIENT DISPOSITION:  PACU - hemodynamically stable.   Delay start of Pharmacological VTE agent (>24hrs) due to surgical blood loss or risk of bleeding: yes

## 2015-09-14 NOTE — Progress Notes (Signed)
Patient transported to short stay in preparation for surgery. Report called to unit. Patient in no apparent discomfort or distress. Patient's son notified of pending surgery and of the request for verbal consent for surgery.  Roselyn Reef Raiya Stainback,RN
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
## 2015-09-14 NOTE — Anesthesia Postprocedure Evaluation (Signed)
  Anesthesia Post-op Note  Patient: [name]  Procedure(s) Performed: Procedure(s): INTRAMEDULLARY (IM) NAIL INTERTROCHANTRIC (Left)  Patient Location: PACU  Anesthesia Type: Spinal   Level of Consciousness: awake, alert  and oriented  Airway and Oxygen Therapy: Patient Spontanous Breathing  Post-op Pain: none  Post-op Assessment: Post-op Vital signs reviewed  Post-op Vital Signs: Reviewed  Last Vitals:  Filed Vitals:   09/14/15 1730  BP: 115/68  Pulse: 123  Temp: 36.1 C  Resp: 19    Complications: No apparent anesthesia complications

## 2015-09-14 NOTE — Discharge Instructions (Signed)
 Dr. Draycen Leichter Adult Hip & Knee Specialist [facility] [address] [place], [place] [address] [phone]   POSTOPERATIVE DIRECTIONS    Hip Rehabilitation, Guidelines Following Surgery   WEIGHT BEARING Weight bearing as tolerated with assist device (walker, cane, etc) as directed, use it as long as suggested by your surgeon or therapist, typically at least 4-6 weeks.   HOME CARE INSTRUCTIONS  Remove items at home which could result in a fall. This includes throw rugs or furniture in walking pathways.  Continue medications as instructed at time of discharge.  You may have some home medications which will be placed on hold until you complete the course of blood thinner medication.  4 days after discharge, you may start showering. No tub baths or soaking your incisions. Do not put on socks or shoes without following the instructions of your caregivers.   Sit on chairs with arms. Use the chair arms to help push yourself up when arising.  Arrange for the use of a toilet seat elevator so you are not sitting low.   Walk with walker as instructed.  You may resume a sexual relationship in one month or when given the OK by your caregiver.  Use walker as long as suggested by your caregivers.  Avoid periods of inactivity such as sitting longer than an hour when not asleep. This helps prevent blood clots.  You may return to work once you are cleared by your surgeon.  Do not drive a car for 6 weeks or until released by your surgeon.  Do not drive while taking narcotics.  Wear elastic stockings for two weeks following surgery during the day but you may remove then at night.  Make sure you keep all of your appointments after your operation with all of your doctors and caregivers. You should call the office at the above phone number and make an appointment for approximately two weeks after the date of your surgery. Please pick up a stool softener and laxative  for home use as long as you are requiring pain medications.  ICE to the affected hip every three hours for 30 minutes at a time and then as needed for pain and swelling. Continue to use ice on the hip for pain and swelling from surgery. You may notice swelling that will progress down to the foot and ankle.  This is normal after surgery.  Elevate the leg when you are not up walking on it.   It is important for you to complete the blood thinner medication as prescribed by your doctor.  Continue to use the breathing machine which will help keep your temperature down.  It is common for your temperature to cycle up and down following surgery, especially at night when you are not up moving around and exerting yourself.  The breathing machine keeps your lungs expanded and your temperature down.  RANGE OF MOTION AND STRENGTHENING EXERCISES  These exercises are designed to help you keep full movement of your hip joint. Follow your caregiver's or physical therapist's instructions. Perform all exercises about fifteen times, three times per day or as directed. Exercise both hips, even if you have had only one joint replacement. These exercises can be done on a training (exercise) mat, on the floor, on a table or on a bed. Use whatever works the best and is most comfortable for you. Use music or television while you are exercising so that the exercises are a pleasant break in your day. This   will make your life better with the exercises acting as a break in routine you can look forward to.  Lying on your back, slowly slide your foot toward your buttocks, raising your knee up off the floor. Then slowly slide your foot back down until your leg is straight again.  Lying on your back spread your legs as far apart as you can without causing discomfort.  Lying on your side, raise your upper leg and foot straight up from the floor as far as is comfortable. Slowly lower the leg and repeat.  Lying on your back, tighten up the  muscle in the front of your thigh (quadriceps muscles). You can do this by keeping your leg straight and trying to raise your heel off the floor. This helps strengthen the largest muscle supporting your knee.  Lying on your back, tighten up the muscles of your buttocks both with the legs straight and with the knee bent at a comfortable angle while keeping your heel on the floor.   SKILLED REHAB INSTRUCTIONS: If the patient is transferred to a skilled rehab facility following release from the hospital, a list of the current medications will be sent to the facility for the patient to continue.  When discharged from the skilled rehab facility, please have the facility set up the patient's Home Health Physical Therapy prior to being released. Also, the skilled facility will be responsible for providing the patient with their medications at time of release from the facility to include their pain medication and their blood thinner medication. If the patient is still at the rehab facility at time of the two week follow up appointment, the skilled rehab facility will also need to assist the patient in arranging follow up appointment in our office and any transportation needs.  MAKE SURE YOU:  Understand these instructions.  Will watch your condition.  Will get help right away if you are not doing well or get worse.  Pick up stool softner and laxative for home use following surgery while on pain medications. Daily dry dressing changes as needed. In 4 days, you may remove your dressings and begin taking showers - no tub baths or soaking the incisions. Continue to use ice for pain and swelling after surgery. Do not use any lotions or creams on the incision until instructed by your surgeon.   

## 2015-09-14 NOTE — ED Notes (Signed)
Chelsea, RN assisted me with in and out cath on patient to obtain an urine sample; collection was successful and sample being sent to lab for testing

## 2015-09-14 NOTE — ED Notes (Signed)
Spoke with pts son, son updated on pts status, pt to be updated at [phone] when CT results are available

## 2015-09-14 NOTE — ED Notes (Signed)
Radiology at bedside
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
## 2015-09-14 NOTE — ED Notes (Signed)
Neurology at bedside.

## 2015-09-14 NOTE — Progress Notes (Signed)
Called nurse to get patient for EEG; patient in pre-op for surgery; will do EEG tomorrow.
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
## 2015-09-14 NOTE — ED Notes (Signed)
Admitting physician made aware that patient has received 2L of NS. Physician advised to hold fluids on patient for now.

## 2015-09-14 NOTE — Transfer of Care (Signed)
Immediate Anesthesia Transfer of Care Note  Patient: [name]  Procedure(s) Performed: Procedure(s): INTRAMEDULLARY (IM) NAIL INTERTROCHANTRIC (Left)  Patient Location: PACU  Anesthesia Type:Spinal  Level of Consciousness: awake, alert , patient cooperative, confused and responds to stimulation  Airway & Oxygen Therapy: Patient Spontanous Breathing and Patient connected to face mask oxygen  Post-op Assessment: Report given to RN, Post -op Vital signs reviewed and stable and Patient able to stick tongue midline  Post vital signs: Reviewed and stable  Last Vitals:  Filed Vitals:   09/14/15 1125  BP: 141/69  Pulse: 119  Temp: 36.5 C  Resp: 16    Complications: No apparent anesthesia complications

## 2015-09-14 NOTE — ED Notes (Signed)
MD at bedside. 

## 2015-09-14 NOTE — ED Notes (Signed)
Attempted report 

## 2015-09-14 NOTE — Consult Note (Signed)
ORTHOPAEDIC CONSULTATION  REQUESTING PHYSICIAN: Debbe Odea, MD  PCP:  Antony Blackbird, MD  Chief Complaint: left hip pain  HPI: [name] is a 81 y.o. female who complains of left hip pain. She does not remember falling, however she was found down this morning. She was unable to weight bear. She was taken to the ED, and workup revealed a comminuted left intertrochanteric femur fracture. CT of the head showed a small IVH. Neurology saw her and cleared her for surgery and requested that we do not use chemical DVT ppx. She denies other injuries.   Past Medical History  Diagnosis Date  . Cancer (HCC)     breast  . Psoriasis   . Legally blind     macular degeneration  . Hypertension   . CAD (coronary artery disease) 2007    Last cath 2007, Last echo-09/01/10 EF >59%, RV Systolic [id number] mmHg;Last Nuc 2006 no ischemia  . Pulmonary HTN (HCC)     mild  . Seizure disorder ([place])        . H/O syncope     remotely, no etiology found, mild carotid disease on doppler, 0-49% bil  . Full dentures   . Wears glasses   . Lives in independent group home     independent living [facility]-  . Memory deficit   . Seasonal allergies   . Hallucinations   . Cataracts, bilateral   . Macular degeneration    Past Surgical History  Procedure Laterality Date  . Cholecystectomy    . Spine surgery    . Appendectomy    . Tonsillectomy    . Cardiac catheterization  11/22/2005    RCA with 20-30% eccentric narrowing other vessels free of disease  . Carpal tunnel release Right 07/29/2013    Procedure: CARPAL TUNNEL RELEASE;  Surgeon: [name], MD;  Location: [place];  Service: Orthopedics;  Laterality: Right;  . Gallbladder surgery     Social History   Social History  . Marital Status: Divorced    Spouse Name: N/A  . Number of Children: 1  . Years of Education: college   Occupational History  .      Retired - Therapist, sports   Social History Main Topics  . Smoking status:  Former Smoker    Types: Cigarettes    Quit date: 11/20/1994  . Smokeless tobacco: Former Systems developer    Quit date: 11/20/1994  . Alcohol Use: No  . Drug Use: No  . Sexual Activity: No   Other Topics Concern  . None   Social History Narrative   Patient lives at home alone. Patient is divorced.   Retired Therapist, sports.   College education.   Right handed.   Caffeine- two cups daily coffee.   Family History  Problem Relation Age of Onset  . Pulmonary embolism Mother 26    caused her death  . CAD Father 97    caused his death  . Stroke Father   . Leukemia Brother 60    cause of death  . CAD Brother   . CAD Brother    Allergies  Allergen Reactions  . Ephedra [Ephedrine] Other (See Comments)    unknown  . Captopril Rash  . Phenobarbital Rash   Prior to Admission medications   Medication Sig Start Date End Date Taking? Authorizing Provider  anastrozole (ARIMIDEX) 1 MG tablet Take 1 tablet (1 mg total) by mouth daily. 07/15/15  Yes Chauncey Cruel, MD  donepezil (ARICEPT) 10 MG  tablet Take 1 tablet (10 mg total) by mouth at bedtime. 07/05/15  Yes Dennie Bible, NP  fexofenadine [facility] ALLERGY) 60 MG tablet Take 1 tablet (60 mg total) by mouth 2 (two) times daily. 07/15/15  Yes Chauncey Cruel, MD  metoprolol (LOPRESSOR) 50 MG tablet Take 50 mg by mouth 2 (two) times daily.  04/23/12  Yes Historical Provider, MD   Dg Chest 1 View  09/14/2015  CLINICAL DATA:  Injury post fall, left hip fracture EXAM: CHEST 1 VIEW COMPARISON:  03/10/2010 FINDINGS: Cardiomediastinal silhouette is stable. Hyperinflation and chronic interstitial prominence again noted. No acute infiltrate or pulmonary edema. No segmental infiltrate. No pneumothorax. IMPRESSION: No active disease. Again noted hyperinflation and chronic interstitial prominence. Electronically Signed   By: Lahoma Crocker M.D.   On: 09/14/2015 08:03   Ct Head Wo Contrast  09/14/2015  CLINICAL DATA:  Unwitnessed fall EXAM: CT HEAD WITHOUT CONTRAST  TECHNIQUE: Contiguous axial images were obtained from the base of the skull through the vertex without intravenous contrast. COMPARISON:  12/28/2013 FINDINGS: Skull and Sinuses:Negative for fracture or destructive process. The mastoids, middle ears, and imaged paranasal sinuses are clear. Orbits: No acute abnormality. Brain: Small volume acute intraventricular hemorrhage layering within the lateral ventricles bilaterally. Stable ventriculomegaly related to atrophy. No contusion or swelling noted. Age congruent small-vessel ischemic changes in the deep cerebral white matter. No evidence of acute infarct, mass lesion, or shift. Linear high-density structure in the left parietal lobe consistent with developmental venous anomaly. Critical Value/emergent results were called by telephone at the time of interpretation on 09/14/2015 at 8:33 am to Dr. Blanchie Dessert , who verbally acknowledged these results. IMPRESSION: 1. Small lateral intraventricular hemorrhage with stable ventricular volume. 2. Stable chronic changes as described above. Electronically Signed   By: Monte Fantasia M.D.   On: 09/14/2015 08:36   Dg Knee Left Port  09/14/2015  CLINICAL DATA:  Fall, left knee pain EXAM: PORTABLE LEFT KNEE - 1-2 VIEW COMPARISON:  12/28/2013 FINDINGS: Two views of the left knee submitted. Mild narrowing of medial joint compartment. There is narrowing of patellofemoral joint space. No acute fracture or subluxation. Spurring of patella. IMPRESSION: No acute fracture or subluxation. Degenerative changes as described above. Electronically Signed   By: Lahoma Crocker M.D.   On: 09/14/2015 09:53   Dg Hip Unilat With Pelvis 2-3 Views Left  09/14/2015  CLINICAL DATA:  Fall with left hip fracture. Initial encounter. EXAM: DG HIP (WITH OR WITHOUT PELVIS) 2-3V LEFT COMPARISON:  None. FINDINGS: Acute intertrochanteric left femur fracture with apex ventral angulation and medial impaction. Both hips are located. No evidence of pelvic  ring fracture or diastasis. Osteopenia and atherosclerosis. IMPRESSION: Displaced intertrochanteric left femur fracture. Electronically Signed   By: Monte Fantasia M.D.   On: 09/14/2015 08:08    Positive ROS: All other systems have been reviewed and were otherwise negative with the exception of those mentioned in the HPI and as above.  Physical Exam: General: Alert, no acute distress Cardiovascular: No pedal edema Respiratory: No cyanosis, no use of accessory musculature GI: No organomegaly, abdomen is soft and non-tender Skin: No lesions in the area of chief complaint Neurologic: Sensation intact distally Psychiatric: Patient is competent for consent with normal mood and affect Lymphatic: No axillary or cervical lymphadenopathy  MUSCULOSKELETAL:  BUE: no trauma. fuill ROM. NTTP. RLE: no deformity of TTP. Painless logroll of hip.   LLE: pain with logroll. Shortened and externally rotated. + TA/GS/EHL. SILT. 2+ DP.  Assessment:  Comminuted L IT femur fracture  Plan: I discussed the findings with the patient and her son, [name], who is POA due to dementia. I recommended surgical fixation to allow mobilization and help with pain control. We discussed that she has about a 30% 1-year mortality risk. The risks, benefits, and alternatives were discussed with the patient.   There are risks associated with the surgery including, but not limited to, problems with anesthesia (death), infection, differences in leg length/angulation/rotation, fracture of bones, loosening or failure of implants, hematoma (blood accumulation) which may require surgical drainage, blood clots, pulmonary embolism, nerve injury (foot drop), and blood vessel injury. The patient / POA understand these risks and elect to proceed.     Niyah Mamaril, Horald Pollen, MD Cell [phone]    09/14/2015 2:21 PM

## 2015-09-14 NOTE — ED Notes (Signed)
Patient reporting need to urinate. Patient unable to be placed on bedpan due to left hip fracture. Spoke with Erin Hearing, NP who is admitting patient, NP Lissa Merlin advised that patient will require foley placement due to hip fracture and need for surgery.

## 2015-09-14 NOTE — Care Management Note (Signed)
Case Management Note  Patient Details  Name: Jacquelene Kopecky MRN: [id number] Date of Birth: 09/12/1925  Subjective/Objective:                    Action/Plan: Patient was admitted with Displaced intertrochanteric fracture of left femur , s/p fall. Lives at home alone, but has a caregiver who assists with ADLs.  Will follow for discharge needs pending PT/OT evals post-operatively.  Expected Discharge Date:                  Expected Discharge Plan:     In-House Referral:     Discharge planning Services     Post Acute Care Choice:    Choice offered to:     DME Arranged:    DME Agency:     HH Arranged:    HH Agency:     Status of Service:  In process, will continue to follow  Medicare Important Message Given:    Date Medicare IM Given:    Medicare IM give by:    Date Additional Medicare IM Given:    Additional Medicare Important Message give by:     If discussed at [place] of Stay Meetings, dates discussed:    Additional Comments:  Rolm Baptise, RN 09/14/2015, 1:52 PM

## 2015-09-14 NOTE — Progress Notes (Signed)
Patient arrived to room 5C20 from the ED. TELE applied and confirmed. Report given to receiving RN at this time.    Ave Filter, RN

## 2015-09-14 NOTE — Anesthesia Preprocedure Evaluation (Signed)
Anesthesia Evaluation  Patient identified by MRN, date of birth, ID band Patient awake    Reviewed: Allergy & Precautions, H&P , NPO status , Patient's Chart, lab work & pertinent test results, reviewed documented beta blocker date and time   Airway Mallampati: I  TM Distance: >3 FB Neck ROM: Full    Dental no notable dental hx.    Pulmonary neg pulmonary ROS, former smoker,    Pulmonary exam normal breath sounds clear to auscultation       Cardiovascular hypertension, Pt. on medications and Pt. on home beta blockers + CAD  Normal cardiovascular exam Rhythm:Regular Rate:Normal     Neuro/Psych negative neurological ROS  negative psych ROS   GI/Hepatic negative GI ROS, Neg liver ROS,   Endo/Other  negative endocrine ROS  Renal/GU negative Renal ROS     Musculoskeletal negative musculoskeletal ROS (+)   Abdominal   Peds  Hematology negative hematology ROS (+)   Anesthesia Other Findings   Reproductive/Obstetrics negative OB ROS                             Anesthesia Physical  Anesthesia Plan  ASA: III  Anesthesia Plan: Spinal   Post-op Pain Management:    Induction:   Airway Management Planned: Simple Face Mask  Additional Equipment:   Intra-op Plan:   Post-operative Plan:   Informed Consent: I have reviewed the patients History and Physical, chart, labs and discussed the procedure including the risks, benefits and alternatives for the proposed anesthesia with the patient or authorized representative who has indicated his/her understanding and acceptance.   Dental advisory given  Plan Discussed with: CRNA  Anesthesia Plan Comments:         Anesthesia Quick Evaluation

## 2015-09-14 NOTE — Progress Notes (Signed)
Repeat lactic acid has normalized to 0.90  Erin Hearing, ANP

## 2015-09-14 NOTE — ED Notes (Signed)
Spoke with emergency contact [name] who reports that she is no longer the pts caregiver, no medical information provided to the emergency contact, this RN left a voicemail at the home & cell numbers listed for the pts son, Sara Rued, MD aware

## 2015-09-14 NOTE — ED Notes (Signed)
Pt in via [facility] per report bystander called EMS d/t finding pt laying outside of her apartment, pt unable to report how she got out of there, pt has small lac to posterior head & c/o L leg pain with no obvious injury, pt shivering upon arrival to ED, pt cannot recall going to bed last night or getting dressed, pt alert to self upon arrival to ED, pt more alert upon arrival to ED, pt reports that someone may have put her outside, pt alert & speaking upon arrival to ED

## 2015-09-14 NOTE — Anesthesia Procedure Notes (Addendum)
Procedure Name: MAC Date/Time: 09/14/2015 2:50 PM Performed by: Vennie Homans Pre-anesthesia Checklist: Patient identified, Timeout performed, Emergency Drugs available, Suction available and Patient being monitored Patient Re-evaluated:Patient Re-evaluated prior to inductionOxygen Delivery Method: Simple face mask Preoxygenation: Pre-oxygenation with 100% oxygen Placement Confirmation: positive ETCO2 Dental Injury: Teeth and Oropharynx as per pre-operative assessment    Spinal Patient location during procedure: OR Staffing Anesthesiologist: Nolon Nations Performed by: anesthesiologist  Preanesthetic Checklist Completed: patient identified, site marked, surgical consent, pre-op evaluation, timeout performed, IV checked, risks and benefits discussed and monitors and equipment checked Spinal Block Patient position: left lateral decubitus Prep: ChloraPrep Patient monitoring: heart rate, continuous pulse ox and blood pressure Approach: right paramedian Location: L2-3 Injection technique: single-shot Needle Needle type: Sprotte  Needle gauge: 24 G Needle length: 9 cm Additional Notes Expiration date of kit checked and confirmed. Patient tolerated procedure well, without complications.

## 2015-09-15 ENCOUNTER — Inpatient Hospital Stay (HOSPITAL_COMMUNITY): Payer: Medicare Other

## 2015-09-15 ENCOUNTER — Encounter (HOSPITAL_COMMUNITY): Payer: Self-pay | Admitting: Orthopedic Surgery

## 2015-09-15 DIAGNOSIS — R410 Disorientation, unspecified: Secondary | ICD-10-CM

## 2015-09-15 DIAGNOSIS — I639 Cerebral infarction, unspecified: Secondary | ICD-10-CM

## 2015-09-15 DIAGNOSIS — I6789 Other cerebrovascular disease: Secondary | ICD-10-CM

## 2015-09-15 DIAGNOSIS — F03918 Unspecified dementia, unspecified severity, with other behavioral disturbance: Secondary | ICD-10-CM | POA: Insufficient documentation

## 2015-09-15 DIAGNOSIS — I10 Essential (primary) hypertension: Secondary | ICD-10-CM | POA: Insufficient documentation

## 2015-09-15 DIAGNOSIS — F0391 Unspecified dementia with behavioral disturbance: Secondary | ICD-10-CM | POA: Insufficient documentation

## 2015-09-15 LAB — BASIC METABOLIC PANEL
ANION GAP: 6 (ref 5–15)
BUN: 12 mg/dL (ref 6–20)
CALCIUM: 7.1 mg/dL — AB (ref 8.9–10.3)
CO2: 24 mmol/L (ref 22–32)
Chloride: 107 mmol/L (ref 101–111)
Creatinine, Ser: 0.79 mg/dL (ref 0.44–1.00)
GLUCOSE: 112 mg/dL — AB (ref 65–99)
POTASSIUM: 3.7 mmol/L (ref 3.5–5.1)
Sodium: 137 mmol/L (ref 135–145)

## 2015-09-15 LAB — URINE CULTURE: CULTURE: NO GROWTH

## 2015-09-15 LAB — CBC
HEMATOCRIT: 32.3 % — AB (ref 36.0–46.0)
Hemoglobin: 10.1 g/dL — ABNORMAL LOW (ref 12.0–15.0)
MCH: 29.4 pg (ref 26.0–34.0)
MCHC: 31.3 g/dL (ref 30.0–36.0)
MCV: 93.9 fL (ref 78.0–100.0)
PLATELETS: 173 10*3/uL (ref 150–400)
RBC: 3.44 MIL/uL — AB (ref 3.87–5.11)
RDW: 15.1 % (ref 11.5–15.5)
WBC: 11.9 10*3/uL — AB (ref 4.0–10.5)

## 2015-09-15 LAB — LIPID PANEL
CHOL/HDL RATIO: 3.3 ratio
CHOLESTEROL: 120 mg/dL (ref 0–200)
HDL: 36 mg/dL — AB (ref >40)
LDL Cholesterol: 76 mg/dL (ref 0–99)
TRIGLYCERIDES: 41 mg/dL (ref <150)
VLDL: 8 mg/dL (ref 0–40)

## 2015-09-15 LAB — CK: CK TOTAL: 1970 U/L — AB (ref 38–234)

## 2015-09-15 LAB — VITAMIN D 25 HYDROXY (VIT D DEFICIENCY, FRACTURES): VIT D 25 HYDROXY: 24.8 ng/mL — AB (ref 30.0–100.0)

## 2015-09-15 MED ORDER — LORAZEPAM 2 MG/ML IJ SOLN
1.0000 mg | Freq: Once | INTRAMUSCULAR | Status: DC | PRN
  Administered 2015-09-15: 1 mg via INTRAVENOUS
  Filled 2015-09-15: qty 1

## 2015-09-15 MED ORDER — OXYCODONE HCL 5 MG PO TABS
5.0000 mg | ORAL_TABLET | Freq: Four times a day (QID) | ORAL | Status: DC | PRN
  Administered 2015-09-15 – 2015-09-16 (×2): 5 mg via ORAL
  Filled 2015-09-15 (×2): qty 1

## 2015-09-15 MED ORDER — POLYETHYLENE GLYCOL 3350 17 G PO PACK
17.0000 g | PACK | Freq: Every day | ORAL | Status: DC
  Administered 2015-09-16 – 2015-09-17 (×2): 17 g via ORAL
  Filled 2015-09-15 (×2): qty 1

## 2015-09-15 MED ORDER — MORPHINE SULFATE (PF) 2 MG/ML IV SOLN
0.5000 mg | INTRAVENOUS | Status: DC | PRN
Start: 2015-09-15 — End: 2015-09-17

## 2015-09-15 MED ORDER — ACETAMINOPHEN 500 MG PO TABS
1000.0000 mg | ORAL_TABLET | Freq: Three times a day (TID) | ORAL | Status: DC
  Administered 2015-09-15 – 2015-09-17 (×6): 1000 mg via ORAL
  Filled 2015-09-15 (×6): qty 2

## 2015-09-15 MED ORDER — ENSURE ENLIVE PO LIQD
237.0000 mL | Freq: Two times a day (BID) | ORAL | Status: DC
  Administered 2015-09-16 – 2015-09-17 (×3): 237 mL via ORAL
  Filled 2015-09-15 (×6): qty 237

## 2015-09-15 NOTE — Evaluation (Signed)
Speech Language Pathology Evaluation Patient Details Name: [name] MRN: [id number] DOB: 1925-01-28 Today's Date: 09/15/2015 Time: 1100-1120 SLP Time Calculation (min) (ACUTE ONLY): 20 min  Problem List:  Patient Active Problem List   Diagnosis Date Noted  . Intraventricular hemorrhage ([place]) 09/14/2015  . Displaced intertrochanteric fracture of left femur ([place]) 09/14/2015  . HTN (hypertension) 09/14/2015  . Dyslipidemia 09/14/2015  . Rhabdomyolysis 09/14/2015  . Elevated lactic acid level 09/14/2015  . Closed pertrochanteric fracture ([place]) 09/14/2015  . Osteopenia 12/15/2013  . Memory loss 12/11/2013  . Visual hallucination (history of) 12/11/2013  . Breast cancer of upper-outer quadrant of right female breast ([place]) 08/18/2013   Past Medical History:  Past Medical History  Diagnosis Date  . Cancer (HCC)     breast  . Psoriasis   . Legally blind     macular degeneration  . Hypertension   . CAD (coronary artery disease) 2007    Last cath 2007, Last echo-09/01/10 EF >54%, RV Systolic [id number] mmHg;Last Nuc 2006 no ischemia  . Pulmonary HTN (HCC)     mild  . Seizure disorder ([place])        . H/O syncope     remotely, no etiology found, mild carotid disease on doppler, 0-49% bil  . Full dentures   . Wears glasses   . Lives in independent group home     independent living [facility]-  . Memory deficit   . Seasonal allergies   . Hallucinations   . Cataracts, bilateral   . Macular degeneration    Past Surgical History:  Past Surgical History  Procedure Laterality Date  . Cholecystectomy    . Spine surgery    . Appendectomy    . Tonsillectomy    . Cardiac catheterization  11/22/2005    RCA with 20-30% eccentric narrowing other vessels free of disease  . Carpal tunnel release Right 07/29/2013    Procedure: CARPAL TUNNEL RELEASE;  Surgeon: [name], MD;  Location: [facility];  Service: Orthopedics;  Laterality: Right;  . Gallbladder surgery      HPI:  81 year old female patient who lives alone but apparently does have a care provider name [name] who works with the patient regularly. She has a past medical history of dyslipidemia, hypertension and breast cancer on oral medications as well as a history of memory loss and prior visual hallucinations. 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 She was sent to the hospital after being found down outside her home on the concrete sidewalk for an unknown period of time. CT head on 09/14/15 revealed a small lateral intraventricular hemorrhage with stable ventricular volume; CXR on 09/14/15 indicated hyperinflation and chronic interstitial prominence with no active disease noted.  Assessment / Plan / Recommendation Clinical Impression   Pt oriented to self only; not situation, time or place.  She has a hx of dementia and has a regular caregiver at home to assist with baseline cognitive deficits with STM loss and performing ADL's.  She is able to follow simple directives and communicate via simple conversation. Pt is also legally blind secondary to macular degeneration, so limited SLE performed.  Pt is at baseline functioning for cognition, so ST not recommended to address these needs.  Dysphagia intervention has been initiated however, due to cognitive deficits which may increase aspiration risk including aspiration precautions and pt/caregiver/family education.    SLP Assessment  Patient does not need any further Speech Language Pathology Services with re: cognition as pt is at baseline level of  functioning; swallow tx indicated   Follow Up Recommendations    SNF (to address swallowing issues)   Frequency and Duration n/a      Pertinent Vitals/Pain Pain Assessment: Faces Faces Pain Scale: No hurt Pain Location: L hip Pain Descriptors / Indicators: Guarding;Grimacing Pain Intervention(s): Limited activity within patient's tolerance;Monitored during session;Premedicated before session;Repositioned;Ice  applied   SLP Goals   n/a  SLP Evaluation Prior Functioning  Cognitive/Linguistic Baseline: Baseline deficits Baseline deficit details: Caregiver stated she has had someone caring for her for ~5 years and has a hx of dementia Type of Home: House  Lives With: Alone;Other (Comment) (consistent caregiver) Available Help at Discharge: [place] Education: Retired Therapist, sports Vocation: Retired   Associate Professor  Overall Cognitive Status: History of cognitive impairments - at baseline Arousal/Alertness: Awake/alert Orientation Level: Oriented to person;Disoriented to place;Disoriented to time;Disoriented to situation Comments:  (Caregiver stated she has a history of STM/cognitive deficits)    Comprehension  Auditory Comprehension Overall Auditory Comprehension: Impaired at baseline Visual Recognition/Discrimination Discrimination: Not tested Reading Comprehension Reading Status: Unable to assess (comment) (legally blind/macular degeneration)    Expression Expression Primary Mode of Expression: Verbal Verbal Expression Overall Verbal Expression: Impaired at baseline Level of Generative/Spontaneous Verbalization: Conversation [employer] of Communication: Not applicable Written Expression Dominant Hand: Right Written Expression: Unable to assess (comment) (macular degeneration/caregiver assists with writing)   Oral / Motor Oral Motor/Sensory Function Overall Oral Motor/Sensory Function: Appears within functional limits for tasks assessed Labial Symmetry: Within Functional Limits Lingual Symmetry: Within Functional Limits Lingual Strength: Within Functional Limits Facial Symmetry: Within Functional Limits Motor Speech Respiration: Within functional limits Phonation: Low vocal intensity Resonance: Within functional limits Articulation: Within functional limitis Intelligibility: Intelligible        ADAMS,PAT, M.S., CCC-SLP 09/15/2015, 11:43 AM

## 2015-09-15 NOTE — Evaluation (Signed)
Clinical/Bedside Swallow Evaluation Patient Details  Name: Mineola Duan MRN: [id number] Date of Birth: 07-Aug-1925  Today's Date: 09/15/2015 Time: SLP Start Time (ACUTE ONLY): 73 SLP Stop Time (ACUTE ONLY): 1100 SLP Time Calculation (min) (ACUTE ONLY): 20 min  Past Medical History:  Past Medical History  Diagnosis Date  . Cancer (HCC)     breast  . Psoriasis   . Legally blind     macular degeneration  . Hypertension   . CAD (coronary artery disease) 2007    Last cath 2007, Last echo-09/01/10 EF >78%, RV Systolic [id number] mmHg;Last Nuc 2006 no ischemia  . Pulmonary HTN (HCC)     mild  . Seizure disorder ([place])        . H/O syncope     remotely, no etiology found, mild carotid disease on doppler, 0-49% bil  . Full dentures   . Wears glasses   . Lives in independent group home     independent living [facility]-  . Memory deficit   . Seasonal allergies   . Hallucinations   . Cataracts, bilateral   . Macular degeneration    Past Surgical History:  Past Surgical History  Procedure Laterality Date  . Cholecystectomy    . Spine surgery    . Appendectomy    . Tonsillectomy    . Cardiac catheterization  11/22/2005    RCA with 20-30% eccentric narrowing other vessels free of disease  . Carpal tunnel release Right 07/29/2013    Procedure: CARPAL TUNNEL RELEASE;  Surgeon: [name], MD;  Location: [facility];  Service: Orthopedics;  Laterality: Right;  . Gallbladder surgery     HPI:  81 year old female patient who lives alone but apparently does have a care provider name [name] who works with the patient regularly. She has a past medical history of dyslipidemia, hypertension and breast cancer on oral medications as well as a history of memory loss and prior visual hallucinations. 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 She was sent to the hospital after being found down outside her home on the concrete sidewalk for an unknown period of time on 09/14/15.  CT head on  09/14/15 revealed a small lateral intraventricular hemorrhage with stable ventricular volume; CXR on 09/14/15 indicated hyperinflation and chronic interstitial prominence with no active disease noted.  Assessment / Plan / Recommendation Clinical Impression   Pt exhibited oral holding and delayed cough with larger amounts of thin liquids via straw during BSE; smaller sips via straw d/t swallowing/breathing coordination eliminated the delayed cough; pt held most consistencies orally, but only for a short period prior to swallowing boluses.  She also has limited elevation with HOB d/t recent hip surgery/pain associated with surgery. ST to f/u for diet tolerance, but regular/thin with smaller sips/bites recommended and HOB elevation as high as tolerated with head neutral position. Caregiver and pt educated re: aspiration risk without these precautions.     Aspiration Risk  Mild    Diet Recommendation Thin;Other (Comment) (Regular)   Medication Administration: Whole meds with puree Compensations: Slow rate;Small sips/bites    Other  Recommendations Oral Care Recommendations: Oral care BID   Follow Up Recommendations    SNF   Frequency and Duration min 2x/week  1 week   Pertinent Vitals/Pain Febrile    SLP Swallow Goals  See POC   Swallow Study Prior Functional Status   Home with caregiver intermittently    General Date of Onset: 09/14/15 Other Pertinent Information: 81 year old female patient who lives alone but  apparently does have a care provider name [name] who works with the patient regularly. She has a past medical history of dyslipidemia, hypertension and breast cancer on oral medications as well as a history of memory loss and prior visual hallucinations. 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 She was sent to the hospital after being found down outside her home on the concrete sidewalk for an unknown period of time. Type of Study: Bedside swallow evaluation Previous Swallow Assessment:  Nursing SSS failed Diet Prior to this Study: NPO Temperature Spikes Noted: Yes Respiratory Status: Supplemental O2 delivered via (comment) ([place]) Behavior/Cognition: Alert;Cooperative;Confused Oral Cavity - Dentition: Adequate natural dentition/normal for age;Other (Comment) (Upper/lower dentures) Self-Feeding Abilities: Needs assist Patient Positioning: Other (comment) (Upright in bed as high as tolerated; head neutral) Baseline Vocal Quality: Low vocal intensity Volitional Cough: Strong Volitional Swallow: Able to elicit    Oral/Motor/Sensory Function Overall Oral Motor/Sensory Function: Appears within functional limits for tasks assessed Labial Symmetry: Within Functional Limits Lingual Symmetry: Within Functional Limits Lingual Strength: Within Functional Limits Facial Symmetry: Within Functional Limits   Ice Chips Ice chips: Within functional limits Presentation: Spoon   Thin Liquid Thin Liquid: Impaired Presentation: Cup;Straw Oral Phase Functional Implications: Oral holding Pharyngeal  Phase Impairments: Cough - Delayed    Nectar Thick Nectar Thick Liquid: Not tested   Honey Thick Honey Thick Liquid: Not tested   Puree Puree: Impaired Presentation: Spoon Oral Phase Functional Implications: Oral holding   Solid       Solid: Within functional limits Presentation: Spoon       ADAMS,PAT, M.S., CCC-SLP 09/15/2015,11:27 AM

## 2015-09-15 NOTE — Progress Notes (Signed)
Initial Nutrition Assessment  INTERVENTION:  Diet advancement per MD Provide Ensure Enlive po BID, each supplement provides 350 kcal and 20 grams of protein   NUTRITION DIAGNOSIS:   Predicted suboptimal nutrient intake related to acute illness, lethargy/confusion as evidenced by per patient/family report, meal completion < 25%.   GOAL:   Patient will meet greater than or equal to 90% of their needs   MONITOR:   Diet advancement, PO intake, Supplement acceptance, Weight trends, Labs, Skin  REASON FOR ASSESSMENT:   Consult Assessment of nutrition requirement/status  ASSESSMENT:   81 year old female patient with a past medical history of dyslipidemia, hypertension and breast cancer on oral medications as well as a history of memory loss and prior visual hallucinations. 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 She was sent to the hospital after being found down outside her home on the concrete sidewalk for an unknown period of time. She had a hip fracture that required surgical repair today.  RD met patient with her son and caregiver at bedside. Pt reports having a good appetite and eating well PTA which caregiver confirms. Pt eats a small breakfast, hot lunch, and small dinner daily. Per son, pt usually maintains 100 lbs but, recently has been weighing 108 lbs. Pt was NPO for breakfast and on clear liquids for lunch; only consumed a few sips/bites. Pt agreeable to receiving Ensure until PO intake is adequate.   Labs: low hemoglobin, low calcium  Diet Order:  Diet clear liquid Room service appropriate?: Yes; Fluid consistency:: Thin  Skin:  Wound (see comment) (closed left hip incision)  Last BM:  PTA  Height:   Ht Readings from Last 1 Encounters:  09/15/15 $RemoveB'5\' 3"'YfROvSLK$  (1.6 m)    Weight:   Wt Readings from Last 1 Encounters:  09/15/15 108 lb (48.988 kg)    Ideal Body Weight:  52.7 kg  BMI:  Body mass index is 19.14 kg/(m^2).  Estimated Nutritional Needs:   Kcal:   1200-1400  Protein:  60-70 grams  Fluid:  1.2-1.4 L/day  EDUCATION NEEDS:   No education needs identified at this time  [place], LDN Inpatient Clinical Dietitian Pager: [phone] After Hours Pager: [phone]

## 2015-09-15 NOTE — Progress Notes (Signed)
VASCULAR LAB PRELIMINARY  PRELIMINARY  PRELIMINARY  PRELIMINARY  Carotid duplex completed.    Preliminary report:  Right :  40-59% internal carotid artery stenosis.  Vertebral artery flow was antegrade with elevated velocities. Left :  1-39% ICA stenosis.  Vertebral artery flow is antegrade.     Jahi Roza, RVS 09/15/2015, 3:04 PM

## 2015-09-15 NOTE — Progress Notes (Addendum)
[facility] PROGRESS NOTE  Attending MD note  Patient was seen, examined,treatment plan was discussed with the PA-S.  I have personally reviewed the clinical findings, lab, imaging studies and management of this patient in detail. I agree with the documentation, as recorded by the PA-S.   Pleasantly confused this morning-underwent repair of the left hip. CK slowly down trending,Slight drop in hemoglobin. Moves all 4 extremities-but limited exam given confusion. Await MRI brain/EEG and echocardiogram. We will continue with supportive measures and continue to follow electrolytes and CK.  [facility]    Stanley MHW:[id number] DOB: 08-22-1925 DOA: 09/14/2015 PCP: Antony Blackbird, MD  Assessment/Plan: Principle Problem Displaced intertrochanteric fracture of left femur:Repaired with surgical fixation by orthopedics on 10/125/16. Vitamin D surveillance serologies- calcium level low. No pharmacologic DVT prophylaxis secondary to intraventricular hemorrhage. Consult PT/OT-suspect will require SNF.  Active  Problems Intraventricular hemorrhage:CT head demonstrates small lateral intraventricular hemorrhage with stable ventricular . MRI/MRA pending. Lipid panel shows LDL 76 ( goal<70)-No role for statins in the setting of rhabdomyolysis. A1C pending. No anticoagulation.  Mild rhabdomyolysis:Elevated CK levels in setting of known down supine for unknown period of time. Repeat CK in the morning. Lactic acid level normalized to 0.90 on 10/25 . Continue IV fluid hydration.  Anemia: Expected perioperative blood loss anemia-monitor hemoglobin.Follow  History of seizures: Await EEG-currently not on any antiepileptics-neurology following  Hypertension:controlled-continue metoprolol. Follow  ?Prolonged QTC: Stop the Zofran-EKG on admission of very poor quality-repeat EKG.Could have prolonged QTC from LBBB.Keep potassium more than 4-Will give 40 mEq 1, check  magnesium  Dementia with behavioral disturbance : Has history of dementia-follows with [facility] neurology as outpatient. Continue Aricept  Breast cancer of the upper-outer quadrant of right female breast:Continue  Arimidex  History of dyslipidemia:Lipid panel shows total cholesterol 120, LDL 76. Not on medication prior to admission  Code Status: Full code Family Communication:None at bedside Disposition Plan: Inpatient-Suspect will require SNF on discharge DVT prophylaxis: SCDs   Consultants:  PT/OT, neurology  Procedures:  none  Antibiotics: See below HPI/Subjective: [name] is an 81 year old female with a history of breast cancer, forgetfulness, hallucinations, seizure disorder and CAD who presented to the ER after being found down on the concrete for an unknown amount of time. Patient cannot recall details that led to her circumstances. She had an injury to the back of her head and CT head showed an interventricular hemorrhage. Patient also had hip/leg pain and imaging showed intertrochanteric fracture that was repaired with surgical fixation by orthopedic surgery. Patient continues to have short-term memory loss and confusion.  Objective: Filed Vitals:   09/15/15 0946  BP: 134/62  Pulse: 119  Temp: 100.4 F (38 C)  Resp: 17    Intake/Output Summary (Last 24 hours) at 09/15/15 1059 Last data filed at 09/15/15 5945  Gross per 24 hour  Intake 1011.25 ml  Output   1050 ml  Net -38.75 ml   There were no vitals filed for this visit.  Exam:   General:  Well developed, well nourished, in no acute distress  Cardiovascular: S1, S2 heart on anterior auscultation.  Respiratory: Normal effort of breathing. No respiratory distress.  Abdomen: Soft, non-tender, non-distended.  Musculoskeletal: Limited ROM due to pain in lower extremities. No edema.   Psychiatric: Alert , not oriented to place, person, time. Speech fluent   Data Reviewed: Basic Metabolic  Panel:  Recent Labs Lab 09/14/15 0732 09/15/15 0626  NA 139 137  K 4.0 3.7  CL 103  107  CO2 25 24  GLUCOSE 149* 112*  BUN 17 12  CREATININE 0.94 0.79  CALCIUM 8.5* 7.1*   Liver Function Tests:  Recent Labs Lab 09/14/15 0732  AST 36  ALT 16  ALKPHOS 69  BILITOT 1.3*  PROT 6.4*  ALBUMIN 3.3*   No results for input(s): LIPASE, AMYLASE in the last 168 hours. No results for input(s): AMMONIA in the last 168 hours. CBC:  Recent Labs Lab 09/14/15 0732 09/15/15 0626  WBC 14.8* 11.9*  NEUTROABS 13.6*  --   HGB 12.9 10.1*  HCT 39.5 32.3*  MCV 91.4 93.9  PLT 252 173   Cardiac Enzymes:  Recent Labs Lab 09/14/15 0732 09/14/15 1842 09/15/15 0626  CKTOTAL 302* 1993* 1970*   BNP (last 3 results) No results for input(s): BNP in the last 8760 hours.  ProBNP (last 3 results) No results for input(s): PROBNP in the last 8760 hours.  CBG: No results for input(s): GLUCAP in the last 168 hours.  Recent Results (from the past 240 hour(s))  Urine culture     Status: None   Collection Time: 09/14/15 10:24 AM  Result Value Ref Range Status   Specimen Description URINE, CATHETERIZED  Final   Special Requests NONE  Final   Culture NO GROWTH 1 DAY  Final   Report Status 09/15/2015 FINAL  Final     Studies: Dg Chest 1 View  09/14/2015  CLINICAL DATA:  Injury post fall, left hip fracture EXAM: CHEST 1 VIEW COMPARISON:  03/10/2010 FINDINGS: Cardiomediastinal silhouette is stable. Hyperinflation and chronic interstitial prominence again noted. No acute infiltrate or pulmonary edema. No segmental infiltrate. No pneumothorax. IMPRESSION: No active disease. Again noted hyperinflation and chronic interstitial prominence. Electronically Signed   By: Lahoma Crocker M.D.   On: 09/14/2015 08:03   Ct Head Wo Contrast  09/14/2015  CLINICAL DATA:  Unwitnessed fall EXAM: CT HEAD WITHOUT CONTRAST TECHNIQUE: Contiguous axial images were obtained from the base of the skull through the vertex  without intravenous contrast. COMPARISON:  12/28/2013 FINDINGS: Skull and Sinuses:Negative for fracture or destructive process. The mastoids, middle ears, and imaged paranasal sinuses are clear. Orbits: No acute abnormality. Brain: Small volume acute intraventricular hemorrhage layering within the lateral ventricles bilaterally. Stable ventriculomegaly related to atrophy. No contusion or swelling noted. Age congruent small-vessel ischemic changes in the deep cerebral white matter. No evidence of acute infarct, mass lesion, or shift. Linear high-density structure in the left parietal lobe consistent with developmental venous anomaly. Critical Value/emergent results were called by telephone at the time of interpretation on 09/14/2015 at 8:33 am to Dr. Blanchie Dessert , who verbally acknowledged these results. IMPRESSION: 1. Small lateral intraventricular hemorrhage with stable ventricular volume. 2. Stable chronic changes as described above. Electronically Signed   By: Monte Fantasia M.D.   On: 09/14/2015 08:36   Pelvis Portable  09/14/2015  CLINICAL DATA:  Status post left femur fracture ORIF EXAM: PORTABLE PELVIS 1-2 VIEWS COMPARISON:  Operative images from this same date FINDINGS: Intra medullary rod supports a compression screw which extends from the lateral proximal femur across the neck into the femoral head. Primary fracture components are in anatomic alignment on this single view. There is a nondisplaced fracture across the base of the lesser trochanter. There is no evidence of a new fracture or operative complication. IMPRESSION: Well aligned fracture following ORIF. Electronically Signed   By: Lajean Manes M.D.   On: 09/14/2015 17:49   Dg Knee Left Port  09/14/2015  CLINICAL  DATA:  Fall, left knee pain EXAM: PORTABLE LEFT KNEE - 1-2 VIEW COMPARISON:  12/28/2013 FINDINGS: Two views of the left knee submitted. Mild narrowing of medial joint compartment. There is narrowing of patellofemoral joint  space. No acute fracture or subluxation. Spurring of patella. IMPRESSION: No acute fracture or subluxation. Degenerative changes as described above. Electronically Signed   By: Lahoma Crocker M.D.   On: 09/14/2015 09:53   Dg C-arm 1-60 Min  09/14/2015  CLINICAL DATA:  ORIF left hip fracture. EXAM: DG C-ARM 61-120 MIN; LEFT FEMUR 2 VIEWS COMPARISON:  Left hip radiographs 09/14/2015. FINDINGS: ORIF is noted. An intra medullary rod is in place. A single distal interlocking screw is noted. A dynamic hip screw is present across the intertrochanteric fracture. Fracture is reduced. IMPRESSION: ORIF of the left femur without radiographic evidence for complication. Electronically Signed   By: San Morelle M.D.   On: 09/14/2015 16:20   Dg Hip Unilat With Pelvis 2-3 Views Left  09/14/2015  CLINICAL DATA:  Fall with left hip fracture. Initial encounter. EXAM: DG HIP (WITH OR WITHOUT PELVIS) 2-3V LEFT COMPARISON:  None. FINDINGS: Acute intertrochanteric left femur fracture with apex ventral angulation and medial impaction. Both hips are located. No evidence of pelvic ring fracture or diastasis. Osteopenia and atherosclerosis. IMPRESSION: Displaced intertrochanteric left femur fracture. Electronically Signed   By: Monte Fantasia M.D.   On: 09/14/2015 08:08   Dg Femur Min 2 Views Left  09/14/2015  CLINICAL DATA:  Status post left-sided IM femoral nail. EXAM: LEFT FEMUR 2 VIEWS COMPARISON:  Fluoroscopic images from earlier same day. FINDINGS: Postoperative film of the left femur shows appropriate positioning of the intra medullary rod. The dynamic hip screw traverses the fracture site and also appears well positioned. A single distal interlocking screw appears intact and well positioned. No surgical complicating features seen. IMPRESSION: Internal fixation of the left femur fracture without evidence of complicating feature. Electronically Signed   By: Franki Cabot M.D.   On: 09/14/2015 17:51   Dg Femur Min 2  Views Left  09/14/2015  CLINICAL DATA:  ORIF left hip fracture. EXAM: DG C-ARM 61-120 MIN; LEFT FEMUR 2 VIEWS COMPARISON:  Left hip radiographs 09/14/2015. FINDINGS: ORIF is noted. An intra medullary rod is in place. A single distal interlocking screw is noted. A dynamic hip screw is present across the intertrochanteric fracture. Fracture is reduced. IMPRESSION: ORIF of the left femur without radiographic evidence for complication. Electronically Signed   By: San Morelle M.D.   On: 09/14/2015 16:20    Scheduled Meds: .  stroke: mapping our early stages of recovery book   Does not apply Once  . anastrozole  1 mg Oral Daily  . donepezil  10 mg Oral QHS  . metoprolol  50 mg Oral BID   Continuous Infusions: . sodium chloride 75 mL/hr at 09/15/15 5621    Principal Problem:   Displaced intertrochanteric fracture of left femur (HCC) Active Problems:   Breast cancer of upper-outer quadrant of right female breast (HCC)   Memory loss   Visual hallucination (history of)   Osteopenia   Intraventricular hemorrhage (HCC)   HTN (hypertension)   Dyslipidemia   Rhabdomyolysis   Elevated lactic acid level   Closed pertrochanteric fracture ([place])    Time spent: 60 minutes    Lawernce Keas - PA-S [facility]  If 7PM-7AM, please contact night-coverage at [email or web address], password [facility] 09/15/2015, 10:59 AM  LOS: 1 day

## 2015-09-15 NOTE — Progress Notes (Signed)
Routine EEG completed, results pending. 

## 2015-09-15 NOTE — Op Note (Signed)
[name], [name] NO.:  [id number]  MEDICAL RECORD NO.:  [id number]  LOCATION:  5C20C                        FACILITY:  [facility]  PHYSICIAN:  Rod Can, MD     DATE OF BIRTH:  May 16, 1925  DATE OF PROCEDURE:  09/14/2015 DATE OF DISCHARGE:                              OPERATIVE REPORT   PREOPERATIVE DIAGNOSIS:  Comminuted left intertrochanteric femur fracture.  POSTOPERATIVE DIAGNOSIS:  Comminuted left intertrochanteric femur fracture.  PROCEDURE PERFORMED:  Cephalomedullary fixation of left intertrochanteric femur fracture.  IMPLANTS: 1. Synthes TFNA nail 11 x 380 mm, 130 degrees. 2. TFNA helical blade 95 mm. 3. 5.0 mm distal interlocking screw x1.  SURGEON:  Rod Can, M.D.  ASSISTANT:  Ky Barban, RNFA.  ANESTHESIA:  Spinal.  EBL:  100 mL.  COMPLICATIONS:  None.  SPECIMENS:  None.  DISPOSITION:  Stable to PACU.  INDICATIONS:  The patient is an 81 year old female, who was found down earlier this morning.  She was complaining of hip pain and inability to weight bear.  She was brought to the emergency department at [facility], where x-rays revealed a comminuted left intertrochanteric femur fracture.  The patient was amnestic to the event, so a CT scan of the head was obtained.  Please note that she does have a chronic dementia.  Her son is her power of attorney.  The CT scan showed a small intraventricular hemorrhage.  Neurology was consulted and they recommended observation.  They determined that it was safe for her to proceed with surgery, but asked that we not give her any chemical DVT prophylaxis postoperatively.  The patient was seen by the hospitalist, underwent perioperative risk stratification and medical optimization.  Risks, benefits, and alternatives to the above-mentioned procedure were explained to the patient and her son and he elected to proceed.  DESCRIPTION OF PROCEDURE IN DETAIL:  The patient was  correctly identified in the holding area using 2 identifiers.  The surgical site was marked by myself.  She was taken to the operating room.  Spinal anesthesia was induced.  She was then positioned on the Hana table.  The right lower extremity was scissored underneath the left.  The fracture was reduced with traction, internal rotation, and adduction.  The left hip was prepped and draped in normal sterile surgical fashion.  Time-out was called verifying site and site of surgery.  She did receive 2 g of IV Ancef within 60 minutes of beginning the procedure.  I began by using fluoroscopy to define her anatomy.  I made a 4 cm incision proximal to the tip of the greater trochanter.  Using a guide pin, I established the normal starting point for a trochanteric entry nail using biplanar fluoroscopy.  I then used the entry reamer and I placed a guidewire down to the physeal scar of the knee.  The fracture continued to be reduced with near anatomic alignment.  I then measured the length of the nail and I reamed up to a 12.5 reamer with excellent chatter.  I selected an 11 x 380 nail.  This was gently impacted into place.  Using a separate stab incision, I advanced a cannula down to the lateral  aspect of the femur.  A guide pin for the cephalomedullary device was placed using AP and lateral fluoroscopy.  Tip apex distance was appropriate.  There was no chondral penetration.  I then measured the length of the wire and I reamed, I then placed the TFNA blade.  I tightened the set screw after compressing the fracture through the jig.  I then removed the jig.  Using perfect circle technique, I placed 1 distal interlocking screw. All wounds were copiously irrigated with saline.  I obtained final fluoroscopy views.  Alignment of the fracture was near anatomic.  Neck shaft angle was appropriate.  There was no chondral penetration.  Tip apex distance was appropriate.  I then closed the wounds with #1  Vicryl for the fascia, 2-0 Monocryl for the deep dermal layer, running 3-0 Monocryl subcuticular stitch.  Glue was applied to the skin.  Once the glue was fully hardened, sterile dressings were applied.  The patient was then transferred to her bed, extubated and taken to PACU in stable condition.  Sponge, needle, and instrument counts were correct at the end of the case x2.  There were no complications.  I discussed the operative events and findings with the patient's son. We will let her weightbear as tolerated with a walker.  We will readmit her to the hospital.  She will receive physical and occupational therapy.  Per neurology's request, we will not give her any chemical DVT prophylaxis.  We will use mechanical prophylaxis.  We will work on disposition planning.  I will see her back in the office in 2 weeks. All questions solicited and answered to their satisfaction.          ______________________________ Rod Can, MD     BS/MEDQ  D:  09/14/2015  T:  09/15/2015  Job:  [id number]

## 2015-09-15 NOTE — Progress Notes (Signed)
   Subjective:  Patient reports pain as mild to moderate.  No c/o.  Objective:   VITALS:   Filed Vitals:   09/14/15 2330 09/15/15 0130 09/15/15 0330 09/15/15 0625  BP: 128/58 144/102 131/50 126/51  Pulse: 124 110 105 113  Temp: 98.3 F (36.8 C) 98.6 F (37 C) 98.6 F (37 C) 98.2 F (36.8 C)  TempSrc:  Oral  Oral  Resp: 18 19 18 18   SpO2: 97% 98% 99% 98%    ABD soft Sensation intact distally Intact pulses distally Dorsiflexion/Plantar flexion intact Incision: dressing C/D/I Compartment soft   Lab Results  Component Value Date   WBC 14.8* 09/14/2015   HGB 12.9 09/14/2015   HCT 39.5 09/14/2015   MCV 91.4 09/14/2015   PLT 252 09/14/2015   BMET    Component Value Date/Time   NA 137 09/15/2015 0626   NA 143 07/15/2015 1234   K 3.7 09/15/2015 0626   K 4.6 07/15/2015 1234   CL 107 09/15/2015 0626   CL 106 04/24/2013 0930   CO2 24 09/15/2015 0626   CO2 30* 07/15/2015 1234   GLUCOSE 112* 09/15/2015 0626   GLUCOSE 92 07/15/2015 1234   GLUCOSE 104* 04/24/2013 0930   BUN 12 09/15/2015 0626   BUN 13.3 07/15/2015 1234   CREATININE 0.79 09/15/2015 0626   CREATININE 0.8 07/15/2015 1234   CALCIUM 7.1* 09/15/2015 0626   CALCIUM 9.0 07/15/2015 1234   GFRNONAA >60 09/15/2015 0626   GFRAA >60 09/15/2015 0626     Assessment/Plan: 1 Day Post-Op   Principal Problem:   Displaced intertrochanteric fracture of left femur (HCC) Active Problems:   Breast cancer of upper-outer quadrant of right female breast (HCC)   Memory loss   Visual hallucination (history of)   Osteopenia   Intraventricular hemorrhage (HCC)   HTN (hypertension)   Dyslipidemia   Rhabdomyolysis   Elevated lactic acid level   Closed pertrochanteric fracture ([place])   WBAT with walker DVT ppx: SCDs, TEDs, not a candidate for chemical pppx IVH per neurology PO pain control PT/OT D/C planning   Sala Tague, Horald Pollen 09/15/2015, 7:41 AM   Rod Can, MD Cell [phone]

## 2015-09-15 NOTE — Progress Notes (Signed)
*  PRELIMINARY RESULTS* Echocardiogram 2D Echocardiogram has been performed.  Leavy Cella 09/15/2015, 9:52 AM

## 2015-09-15 NOTE — Progress Notes (Signed)
Subjective: No changes over night.   Objective: Current vital signs: BP 126/51 mmHg  Pulse 113  Temp(Src) 98.2 F (36.8 C) (Oral)  Resp 18  SpO2 98% Vital signs in last 24 hours: Temp:  [97 F (36.1 C)-98.6 F (37 C)] 98.2 F (36.8 C) (10/26 0625) Pulse Rate:  [105-124] 113 (10/26 0625) Resp:  [15-20] 18 (10/26 0625) BP: (93-144)/(50-102) 126/51 mmHg (10/26 0625) SpO2:  [96 %-100 %] 98 % (10/26 0625)  Intake/Output from previous day: 10/25 0701 - 10/26 0700 In: 2011.3 [I.V.:1911.3; IV Piggyback:100] Out: 1050 [Urine:900; Blood:150] Intake/Output this shift:   Nutritional status:    Neurologic Exam: Mental Status: Alert, not oriented, does not recall surgery or the fact she is in the hospital.  Speech fluent without evidence of aphasia. Able to follow simple commands without difficulty. Cranial Nerves: II:  Visual fields she is legally blind with macular degeneration.--vision exam is very poor and she needs to scan area to count fingers. pupils equal, round, reactive to light and accommodation III,IV, VI: ptosis not present, extra-ocular motions intact bilaterally V,VII: smile symmetric, facial light touch sensation normal bilaterally VIII: hearing normal bilaterally IX,X: uvula rises symmetrically XI: bilateral shoulder shrug XII: midline tongue extension Motor: Right :Upper extremity 5/5Left: Upper extremity 5/5 Lower extremity 4/5Lower extremity 2/5 (pain from surgery) Tone and bulk:normal tone throughout; no atrophy noted Sensory: Pinprick and light touch intact throughout, bilaterally--with no neglect Deep Tendon Reflexes: 2+ and symmetric throughout UE KJ no AJ Plantars: Right: downgoingLeft: downgoing   Lab Results: Basic Metabolic Panel:  Recent Labs Lab 09/14/15 0732 09/15/15 0626  NA 139 137  K 4.0 3.7   CL 103 107  CO2 25 24  GLUCOSE 149* 112*  BUN 17 12  CREATININE 0.94 0.79  CALCIUM 8.5* 7.1*    Liver Function Tests:  Recent Labs Lab 09/14/15 0732  AST 36  ALT 16  ALKPHOS 69  BILITOT 1.3*  PROT 6.4*  ALBUMIN 3.3*   No results for input(s): LIPASE, AMYLASE in the last 168 hours. No results for input(s): AMMONIA in the last 168 hours.  CBC:  Recent Labs Lab 09/14/15 0732 09/15/15 0626  WBC 14.8* 11.9*  NEUTROABS 13.6*  --   HGB 12.9 10.1*  HCT 39.5 32.3*  MCV 91.4 93.9  PLT 252 173    Cardiac Enzymes:  Recent Labs Lab 09/14/15 0732 09/14/15 1842 09/15/15 0626  CKTOTAL 302* 1993* 1970*    Lipid Panel:  Recent Labs Lab 09/15/15 0626  CHOL 120  TRIG 41  HDL 36*  CHOLHDL 3.3  VLDL 8  LDLCALC 76    CBG: No results for input(s): GLUCAP in the last 168 hours.  Microbiology: Results for orders placed or performed during the hospital encounter of 09/14/15  Urine culture     Status: None   Collection Time: 09/14/15 10:24 AM  Result Value Ref Range Status   Specimen Description URINE, CATHETERIZED  Final   Special Requests NONE  Final   Culture NO GROWTH 1 DAY  Final   Report Status 09/15/2015 FINAL  Final    Coagulation Studies: No results for input(s): LABPROT, INR in the last 72 hours.  Imaging: Dg Chest 1 View  09/14/2015  CLINICAL DATA:  Injury post fall, left hip fracture EXAM: CHEST 1 VIEW COMPARISON:  03/10/2010 FINDINGS: Cardiomediastinal silhouette is stable. Hyperinflation and chronic interstitial prominence again noted. No acute infiltrate or pulmonary edema. No segmental infiltrate. No pneumothorax. IMPRESSION: No active disease. Again noted hyperinflation and chronic interstitial  prominence. Electronically Signed   By: Lahoma Crocker M.D.   On: 09/14/2015 08:03   Ct Head Wo Contrast  09/14/2015  CLINICAL DATA:  Unwitnessed fall EXAM: CT HEAD WITHOUT CONTRAST TECHNIQUE: Contiguous axial images were obtained from the base of the  skull through the vertex without intravenous contrast. COMPARISON:  12/28/2013 FINDINGS: Skull and Sinuses:Negative for fracture or destructive process. The mastoids, middle ears, and imaged paranasal sinuses are clear. Orbits: No acute abnormality. Brain: Small volume acute intraventricular hemorrhage layering within the lateral ventricles bilaterally. Stable ventriculomegaly related to atrophy. No contusion or swelling noted. Age congruent small-vessel ischemic changes in the deep cerebral white matter. No evidence of acute infarct, mass lesion, or shift. Linear high-density structure in the left parietal lobe consistent with developmental venous anomaly. Critical Value/emergent results were called by telephone at the time of interpretation on 09/14/2015 at 8:33 am to Dr. Blanchie Dessert , who verbally acknowledged these results. IMPRESSION: 1. Small lateral intraventricular hemorrhage with stable ventricular volume. 2. Stable chronic changes as described above. Electronically Signed   By: Monte Fantasia M.D.   On: 09/14/2015 08:36   Pelvis Portable  09/14/2015  CLINICAL DATA:  Status post left femur fracture ORIF EXAM: PORTABLE PELVIS 1-2 VIEWS COMPARISON:  Operative images from this same date FINDINGS: Intra medullary rod supports a compression screw which extends from the lateral proximal femur across the neck into the femoral head. Primary fracture components are in anatomic alignment on this single view. There is a nondisplaced fracture across the base of the lesser trochanter. There is no evidence of a new fracture or operative complication. IMPRESSION: Well aligned fracture following ORIF. Electronically Signed   By: Lajean Manes M.D.   On: 09/14/2015 17:49   Dg Knee Left Port  09/14/2015  CLINICAL DATA:  Fall, left knee pain EXAM: PORTABLE LEFT KNEE - 1-2 VIEW COMPARISON:  12/28/2013 FINDINGS: Two views of the left knee submitted. Mild narrowing of medial joint compartment. There is narrowing of  patellofemoral joint space. No acute fracture or subluxation. Spurring of patella. IMPRESSION: No acute fracture or subluxation. Degenerative changes as described above. Electronically Signed   By: Lahoma Crocker M.D.   On: 09/14/2015 09:53   Dg C-arm 1-60 Min  09/14/2015  CLINICAL DATA:  ORIF left hip fracture. EXAM: DG C-ARM 61-120 MIN; LEFT FEMUR 2 VIEWS COMPARISON:  Left hip radiographs 09/14/2015. FINDINGS: ORIF is noted. An intra medullary rod is in place. A single distal interlocking screw is noted. A dynamic hip screw is present across the intertrochanteric fracture. Fracture is reduced. IMPRESSION: ORIF of the left femur without radiographic evidence for complication. Electronically Signed   By: San Morelle M.D.   On: 09/14/2015 16:20   Dg Hip Unilat With Pelvis 2-3 Views Left  09/14/2015  CLINICAL DATA:  Fall with left hip fracture. Initial encounter. EXAM: DG HIP (WITH OR WITHOUT PELVIS) 2-3V LEFT COMPARISON:  None. FINDINGS: Acute intertrochanteric left femur fracture with apex ventral angulation and medial impaction. Both hips are located. No evidence of pelvic ring fracture or diastasis. Osteopenia and atherosclerosis. IMPRESSION: Displaced intertrochanteric left femur fracture. Electronically Signed   By: Monte Fantasia M.D.   On: 09/14/2015 08:08   Dg Femur Min 2 Views Left  09/14/2015  CLINICAL DATA:  Status post left-sided IM femoral nail. EXAM: LEFT FEMUR 2 VIEWS COMPARISON:  Fluoroscopic images from earlier same day. FINDINGS: Postoperative film of the left femur shows appropriate positioning of the intra medullary rod. The dynamic hip screw  traverses the fracture site and also appears well positioned. A single distal interlocking screw appears intact and well positioned. No surgical complicating features seen. IMPRESSION: Internal fixation of the left femur fracture without evidence of complicating feature. Electronically Signed   By: Franki Cabot M.D.   On: 09/14/2015 17:51    Dg Femur Min 2 Views Left  09/14/2015  CLINICAL DATA:  ORIF left hip fracture. EXAM: DG C-ARM 61-120 MIN; LEFT FEMUR 2 VIEWS COMPARISON:  Left hip radiographs 09/14/2015. FINDINGS: ORIF is noted. An intra medullary rod is in place. A single distal interlocking screw is noted. A dynamic hip screw is present across the intertrochanteric fracture. Fracture is reduced. IMPRESSION: ORIF of the left femur without radiographic evidence for complication. Electronically Signed   By: San Morelle M.D.   On: 09/14/2015 16:20    Medications:  Scheduled: .  stroke: mapping our early stages of recovery book   Does not apply Once  . anastrozole  1 mg Oral Daily  . donepezil  10 mg Oral QHS  . metoprolol  50 mg Oral BID    Assessment/Plan:  81 YO female S/P fall with left hip fracture S/P surgery. CT revealed bilateral IVH. EEG and MRI/MRA pending. Exam today is unchanged.   Recommend: 1) MRI/MRA brain 2) EEG  3) seizure precautions. No AED indicated at this time.       Etta Quill PA-C [facility] [phone]  09/15/2015, 9:43 AM

## 2015-09-15 NOTE — NC FL2 (Signed)
[place] MEDICAID FL2 LEVEL OF CARE SCREENING TOOL     IDENTIFICATION  Patient Name: [name] Birthdate: 09-19-1925 Sex: female Admission Date (Current Location): 09/14/2015  [facility] and [place] Number:     Facility and Address:  [facility], [place] [address] [place], [place] [address]      Provider Number: [id number]  Attending Physician Name and Address:  Jonetta Osgood, MD  Relative Name and Phone Number:  Kassie Mends    Current Level of Care: SNF Recommended Level of Care: [place] Prior Approval Number:    Date Approved/Denied:   PASRR Number:   [id number]   Discharge Plan: SNF    Current Diagnoses: Patient Active Problem List   Diagnosis Date Noted  . Intraventricular hemorrhage ([place]) 09/14/2015  . Displaced intertrochanteric fracture of left femur ([place]) 09/14/2015  . HTN (hypertension) 09/14/2015  . Dyslipidemia 09/14/2015  . Rhabdomyolysis 09/14/2015  . Elevated lactic acid level 09/14/2015  . Closed pertrochanteric fracture ([place]) 09/14/2015  . Osteopenia 12/15/2013  . Memory loss 12/11/2013  . Visual hallucination (history of) 12/11/2013  . Breast cancer of upper-outer quadrant of right female breast ([place]) 08/18/2013    Orientation ACTIVITIES/SOCIAL BLADDER RESPIRATION  Self   Indwelling catheter, Incontinent O2 (As needed) (3 L)  BEHAVIORAL SYMPTOMS/MOOD NEUROLOGICAL BOWEL NUTRITION STATUS      Continent Diet  PHYSICIAN VISITS COMMUNICATION OF NEEDS Height & Weight Skin    Verbally   112 lbs. Surgical wounds          AMBULATORY STATUS RESPIRATION    Assist extensive O2 (As needed) (3 L)      Personal Care Assistance Level of Assistance  Bathing, Dressing Bathing Assistance: Maximum assistance   Dressing Assistance: Maximum assistance      Functional Limitations Info                SPECIAL CARE FACTORS FREQUENCY  PT (By licensed PT)                   Additional Factors  Info  Allergies   Allergies Info: Ephedra, captobril, phenobarbital           Current Medications (09/15/2015): Current Facility-Administered Medications  Medication Dose Route Frequency Provider Last Rate Last Dose  .  stroke: mapping our early stages of recovery book   Does not apply Once Samella Parr, NP      . 0.9 %  sodium chloride infusion   Intravenous Continuous Debbe Odea, MD 75 mL/hr at 09/15/15 0525    . acetaminophen (TYLENOL) tablet 650 mg  650 mg Oral Q6H PRN Rod Can, MD       Or  . acetaminophen (TYLENOL) suppository 650 mg  650 mg Rectal Q6H PRN Rod Can, MD      . anastrozole (ARIMIDEX) tablet 1 mg  1 mg Oral Daily Samella Parr, NP   1 mg at 09/14/15 1200  . donepezil (ARICEPT) tablet 10 mg  10 mg Oral QHS Samella Parr, NP      . fentaNYL (SUBLIMAZE) injection 25 mcg  25 mcg Intravenous Q2H PRN Blanchie Dessert, MD   50 mcg at 09/14/15 1545  . HYDROcodone-acetaminophen (NORCO/VICODIN) 5-325 MG per tablet 1-2 tablet  1-2 tablet Oral Q6H PRN Rod Can, MD      . menthol-cetylpyridinium (CEPACOL) lozenge 3 mg  1 lozenge Oral PRN Rod Can, MD       Or  . phenol (CHLORASEPTIC) mouth spray 1 spray  1 spray Mouth/Throat PRN Rod Can, MD      . metoCLOPramide (REGLAN) tablet 5-10 mg  5-10 mg Oral Q8H PRN Rod Can, MD       Or  . metoCLOPramide (REGLAN) injection 5-10 mg  5-10 mg Intravenous Q8H PRN Rod Can, MD      . metoprolol (LOPRESSOR) tablet 50 mg  50 mg Oral BID Samella Parr, NP      . morphine 2 MG/ML injection 0.5 mg  0.5 mg Intravenous Q2H PRN Rod Can, MD   0.5 mg at 09/15/15 0303  . ondansetron (ZOFRAN) tablet 4 mg  4 mg Oral Q6H PRN Rod Can, MD       Or  . ondansetron (ZOFRAN) injection 4 mg  4 mg Intravenous Q6H PRN Rod Can, MD       Do not use this list as official medication orders. Please verify with discharge summary.  Discharge Medications:   Medication List    ASK your doctor  about these medications        ALLEGRA ALLERGY 60 MG tablet  Generic drug:  fexofenadine  Take 1 tablet (60 mg total) by mouth 2 (two) times daily.     anastrozole 1 MG tablet  Commonly known as:  ARIMIDEX  Take 1 tablet (1 mg total) by mouth daily.     donepezil 10 MG tablet  Commonly known as:  ARICEPT  Take 1 tablet (10 mg total) by mouth at bedtime.     metoprolol 50 MG tablet  Commonly known as:  LOPRESSOR  Take 50 mg by mouth 2 (two) times daily.        Relevant Imaging Results:  Relevant Lab Results:  Recent Labs    Additional Information    Cranford Mon, LCSW

## 2015-09-15 NOTE — Clinical Social Work Placement (Signed)
   CLINICAL SOCIAL WORK PLACEMENT  NOTE  Date:  09/15/2015  Patient Details  Name: [name] MRN: [id number] Date of Birth: 02/21/1925  Clinical Social Work is seeking post-discharge placement for this patient at the [place] level of care (*CSW will initial, date and re-position this form in  chart as items are completed):  Yes   Patient/family provided with [place] Work Department's list of facilities offering this level of care within the geographic area requested by the patient (or if unable, by the patient's family).  Yes   Patient/family informed of their freedom to choose among providers that offer the needed level of care, that participate in Medicare, Medicaid or managed care program needed by the patient, have an available bed and are willing to accept the patient.  Yes   Patient/family informed of [facility]'s ownership interest in [facility] and [facility], as well as of the fact that they are under no obligation to receive care at these facilities.  PASRR submitted to EDS on 09/15/15     PASRR number received on 09/15/15     Existing PASRR number confirmed on       FL2 transmitted to all facilities in geographic area requested by pt/family on 09/15/15     FL2 transmitted to all facilities within larger geographic area on       Patient informed that his/her managed care company has contracts with or will negotiate with certain facilities, including the following:            Patient/family informed of bed offers received.  Patient chooses bed at       Physician recommends and patient chooses bed at      Patient to be transferred to   on  .  Patient to be transferred to facility by       Patient family notified on   of transfer.  Name of family member notified:        PHYSICIAN Please sign FL2     Additional Comment:    _______________________________________________ Cranford Mon, LCSW 09/15/2015,  10:48 AM

## 2015-09-15 NOTE — Evaluation (Signed)
Occupational Therapy Evaluation Patient Details Name: [name] MRN: [id number] DOB: 04/09/1925 Today's Date: 09/15/2015    History of Present Illness Pt is an 81 y/o female who was found outside her apartment on the ground having sustained a L hip fx and small R intraventricular hemorrage. Underwent IM nailing of L hip on 09/14/15. Pt with PMH: macular degeneration, dementia, CAD, pulm hypertension, syncope, and breast cancer.   Clinical Impression   Pt was able to ambulate without a device and was independent in self care, simple housekeeping and cold meal prep.  She has a caregiver who checks on her daily for a couple of hours.  Pt presents with generalized weakness and expected post op pain, impaired balance, baseline impairment in cognition and vision. She will require SNF level rehab upon discharge.  Will follow.    Follow Up Recommendations  SNF;Supervision/Assistance - 24 hour    Equipment Recommendations       Recommendations for Other Services       Precautions / Restrictions Precautions Precautions: Fall Restrictions Weight Bearing Restrictions: Yes LLE Weight Bearing: Weight bearing as tolerated      Mobility Bed Mobility Overal bed mobility: +2 for physical assistance;Needs Assistance Bed Mobility: Supine to Sit;Sit to Supine     Supine to sit: +2 for physical assistance;Max assist Sit to supine: +2 for physical assistance;Max assist   General bed mobility comments: step by step cues and use of rail, assist for LEs and trunk, pt able to scoot hips forward and back with use of pad  Transfers Overall transfer level: Needs assistance   Transfers: Sit to/from Stand Sit to Stand: +2 physical assistance;Max assist         General transfer comment: assist to rise and for balance, attempted to step toward [facility] with pt attempting to pivot her R foot, but unable to move her L    Balance Overall balance assessment: Needs assistance Sitting-balance support:  Feet supported Sitting balance-Leahy Scale: Fair Sitting balance - Comments: once she gained, confidence, able to sit EOB with close guard     Standing balance-Leahy Scale: Poor                              ADL Overall ADL's : Needs assistance/impaired Eating/Feeding: NPO (awaiting swallow test)   Grooming: Wash/dry hands;Wash/dry face;Sitting;Set up   Upper Body Bathing: Moderate assistance;Sitting   Lower Body Bathing: Total assistance;+2 for physical assistance;Sit to/from stand   Upper Body Dressing : Moderate assistance;Sitting   Lower Body Dressing: +2 for physical assistance;Total assistance;Sit to/from stand                       Vision Additional Comments: significant visual limitations, adapted call button with tactile cue   Perception     Praxis      Pertinent Vitals/Pain Pain Assessment: Faces Faces Pain Scale: Hurts whole lot Pain Location: L hip Pain Descriptors / Indicators: Guarding;Grimacing Pain Intervention(s): Limited activity within patient's tolerance;Monitored during session;Premedicated before session;Repositioned;Ice applied     Hand Dominance Right   Extremity/Trunk Assessment Upper Extremity Assessment Upper Extremity Assessment: Overall WFL for tasks assessed   Lower Extremity Assessment Lower Extremity Assessment: Defer to PT evaluation       Communication Communication Communication: No difficulties   Cognition Arousal/Alertness: Awake/alert Behavior During Therapy: Anxious Overall Cognitive Status: History of cognitive impairments - at baseline  General Comments       Exercises       Shoulder Instructions      Home Living Family/patient expects to be discharged to:: Skilled nursing facility                                        Prior Functioning/Environment Level of Independence: Needs assistance  Gait / Transfers Assistance Needed: walked without a  device ADL's / Homemaking Assistance Needed: Pt able to perform self care, light housekeeping, and assemble a cold meal   Comments: Per Ms. Zenia Resides, her caregiver who visits daily.    OT Diagnosis: Generalized weakness;Cognitive deficits;Blindness and low vision;Acute pain   OT Problem List: Decreased strength;Decreased activity tolerance;Impaired balance (sitting and/or standing);Decreased cognition;Impaired vision/perception;Decreased knowledge of use of DME or AE;Decreased safety awareness;Pain   OT Treatment/Interventions: Self-care/ADL training;DME and/or AE instruction;Balance training;Patient/family education;Therapeutic activities;Cognitive remediation/compensation    OT Goals(Current goals can be found in the care plan section) Acute Rehab OT Goals OT Goal Formulation: Patient unable to participate in goal setting Time For Goal Achievement: 09/29/15 Potential to Achieve Goals: Good ADL Goals Pt Will Perform Eating: with supervision;sitting (with adaptations for vision as needed) Pt Will Perform Grooming: with supervision;sitting (with verbal cues due to vision) Pt Will Perform Upper Body Bathing: with min assist;sitting Pt Will Perform Upper Body Dressing: with min assist;sitting Pt Will Transfer to Toilet: with mod assist;stand pivot transfer;bedside commode Additional ADL Goal #1: Pt will perform bed mobilty with mod assist as a precursor to ADL at EOB.  OT Frequency: Min 2X/week   Barriers to D/C:            Co-evaluation PT/OT/SLP Co-Evaluation/Treatment: Yes Reason for Co-Treatment: Complexity of the patient's impairments (multi-system involvement);For patient/therapist safety PT goals addressed during session: Mobility/safety with mobility;Balance OT goals addressed during session: ADL's and self-care      End of Session Equipment Utilized During Treatment: Gait belt;Oxygen  Activity Tolerance: Patient limited by pain Patient left: in bed;with call bell/phone  within reach;with bed alarm set;with family/visitor present   Time: 1000-1029 OT Time Calculation (min): 29 min Charges:  OT General Charges $OT Visit: 1 Procedure OT Evaluation $Initial OT Evaluation Tier I: 1 Procedure G-Codes:    Malka So 09/15/2015, 11:17 AM  [phone]

## 2015-09-15 NOTE — Evaluation (Signed)
Physical Therapy Evaluation Patient Details Name: [name] MRN: [id number] DOB: 1925-09-07 Today's Date: 09/15/2015   History of Present Illness  Pt is an 81 y/o female who was found outside her apartment on the ground having sustained a L hip fx and small R intraventricular hemorrage. Underwent IM nailing of L hip on 09/14/15. Pt with PMH: macular degeneration, dementia, CAD, pulm hypertension, syncope, and breast cancer.  Clinical Impression  Pt admitted with above diagnosis. Pt currently with functional limitations due to the deficits listed below (see PT Problem List). At the time of PT eval pt was able to perform transfers with +2 max assist. Pain was a limiting factor, however pt was motivated to participate. Pt will benefit from skilled PT to increase their independence and safety with mobility to allow discharge to the venue listed below.       Follow Up Recommendations SNF;Supervision/Assistance - 24 hour    Equipment Recommendations  Rolling walker with 5" wheels;3in1 (PT)    Recommendations for Other Services       Precautions / Restrictions Precautions Precautions: Fall Precaution Comments: Macular degeneration - vision is very low Restrictions Weight Bearing Restrictions: Yes LLE Weight Bearing: Weight bearing as tolerated      Mobility  Bed Mobility Overal bed mobility: +2 for physical assistance;Needs Assistance Bed Mobility: Supine to Sit;Sit to Supine     Supine to sit: +2 for physical assistance;Max assist Sit to supine: +2 for physical assistance;Max assist   General bed mobility comments: Step by step cues and hand-over-hand assist to reach for the rail. Assist was provided for LE movement and trunk elevation. Pt able to scoot hips forward and back with use of pad  Transfers Overall transfer level: Needs assistance Equipment used: 2 person hand held assist Transfers: Sit to/from Stand Sit to Stand: +2 physical assistance;Max assist          General transfer comment: assist to rise and for balance, attempted to step toward [facility] with pt attempting to pivot her R foot, but unable to move her L  Ambulation/Gait             General Gait Details: Unable at this time.   Stairs            Wheelchair Mobility    Modified Rankin (Stroke Patients Only) Modified Rankin (Stroke Patients Only) Pre-Morbid Rankin Score: No significant disability Modified Rankin: Severe disability     Balance Overall balance assessment: Needs assistance Sitting-balance support: Feet supported;No upper extremity supported Sitting balance-Leahy Scale: Fair Sitting balance - Comments: once she gained confidence, able to sit EOB with close guard   Standing balance support: Bilateral upper extremity supported;During functional activity Standing balance-Leahy Scale: Zero                               Pertinent Vitals/Pain Pain Assessment: Faces Faces Pain Scale: Hurts whole lot Pain Location: L hip Pain Descriptors / Indicators: Operative site guarding;Grimacing Pain Intervention(s): Limited activity within patient's tolerance;Monitored during session;Repositioned    Home Living Family/patient expects to be discharged to:: Skilled nursing facility                      Prior Function Level of Independence: Needs assistance   Gait / Transfers Assistance Needed: walked without a device  ADL's / Homemaking Assistance Needed: Pt able to perform self care, light housekeeping, and assemble a cold meal  Comments: Per Ms.  Zenia Resides, her caregiver who visits daily.     Hand Dominance   Dominant Hand: Right    Extremity/Trunk Assessment   Upper Extremity Assessment: Defer to OT evaluation           Lower Extremity Assessment: LLE deficits/detail   LLE Deficits / Details: Decreased strength/AROM and acute pain consistent with recent IM nailing  Cervical / Trunk Assessment: Kyphotic  Communication    Communication: No difficulties  Cognition Arousal/Alertness: Awake/alert Behavior During Therapy: Anxious Overall Cognitive Status: History of cognitive impairments - at baseline                      General Comments      Exercises        Assessment/Plan    PT Assessment Patient needs continued PT services  PT Diagnosis Difficulty walking;Acute pain   PT Problem List Decreased strength;Decreased range of motion;Decreased activity tolerance;Decreased balance;Decreased mobility;Decreased knowledge of use of DME;Decreased safety awareness;Decreased knowledge of precautions;Pain  PT Treatment Interventions DME instruction;Gait training;Stair training;Functional mobility training;Therapeutic activities;Therapeutic exercise;Neuromuscular re-education;Patient/family education   PT Goals (Current goals can be found in the Care Plan section) Acute Rehab PT Goals Patient Stated Goal: Decrease pain PT Goal Formulation: With patient Time For Goal Achievement: 09/22/15 Potential to Achieve Goals: Good    Frequency Min 3X/week   Barriers to discharge Decreased caregiver support Caregiver is not 24 hours.    Co-evaluation PT/OT/SLP Co-Evaluation/Treatment: Yes Reason for Co-Treatment: Complexity of the patient's impairments (multi-system involvement);For patient/therapist safety PT goals addressed during session: Mobility/safety with mobility;Balance OT goals addressed during session: ADL's and self-care       End of Session Equipment Utilized During Treatment: Gait belt Activity Tolerance: Patient limited by pain Patient left: in bed;with call bell/phone within reach;with bed alarm set;with family/visitor present Nurse Communication: Mobility status         Time: 1000-1023 PT Time Calculation (min) (ACUTE ONLY): 23 min   Charges:   PT Evaluation $Initial PT Evaluation Tier I: 1 Procedure     PT G CodesRolinda Roan 09-24-2015, 11:24 AM   Rolinda Roan, PT, DPT Acute Rehabilitation Services Pager: [phone]

## 2015-09-15 NOTE — Clinical Social Work Note (Signed)
Clinical Social Work Assessment  Patient Details  Name: [name] MRN: [id number] Date of Birth: Nov 26, 1924  Date of referral:  09/15/15               Reason for consult:  Facility Placement                Permission sought to share information with:  Facility Sport and exercise psychologist, Family Supports Permission granted to share information::  No (pt disoriented)  Name::     Kassie Mends  Agency::  [facility]  Relationship::  son  Contact Information:     Housing/Transportation Living arrangements for the past 2 months:  Charity fundraiser of Information:  Adult Children Patient Interpreter Needed:  None Criminal Activity/Legal Involvement Pertinent to Current Situation/Hospitalization:  No - Comment as needed Significant Relationships:  Adult Children Lives with:  Self Do you feel safe going back to the place where you live?  No Need for family participation in patient care:  Yes (Comment) (decision making)  Care giving concerns:  Pt lives alone (in independent living center) and has caregiver support a few hours a day but no 24 hours assistance   Facilities manager / plan:  CSW spoke with pt son concerning MD recommendation for SNF  Employment status:  Retired Nurse, adult PT Recommendations:  Not assessed at this time Information / Referral to community resources:  [place]  Patient/Family's Response to care:  Pt son is agreeable to short term SNF placement in [facility]  Patient/Family's Understanding of and Emotional Response to Diagnosis, Current Treatment, and Prognosis:  No questions or concerns- pt son hopeful pt will recover soon  Emotional Assessment Appearance:  Appears stated age Attitude/Demeanor/Rapport:  Unable to Assess Affect (typically observed):  Unable to Assess Orientation:  Oriented to Self Alcohol / Substance use:  Not Applicable Psych involvement (Current and /or  in the community):  No (Comment)  Discharge Needs  Concerns to be addressed:  Home Safety Concerns, Care Coordination Readmission within the last 30 days:  No Current discharge risk:  Lives alone, Physical Impairment Barriers to Discharge:  Continued Medical Work up   [employer], LCSW 09/15/2015, 10:47 AM

## 2015-09-15 NOTE — Procedures (Signed)
ELECTROENCEPHALOGRAM REPORT   Patient: [name]      Room #: 23C-20 Age: 81 y.o.        Sex: female Referring Physician: Dr Sloan Leiter Report Date:  09/15/2015        Interpreting Physician: Hulen Luster  History: [name] is an 81 y.o. female with AMS, CT head showing IVH  Medications:  Scheduled: .  stroke: mapping our early stages of recovery book   Does not apply Once  . anastrozole  1 mg Oral Daily  . donepezil  10 mg Oral QHS  . [START ON 09/16/2015] feeding supplement (ENSURE ENLIVE)  237 mL Oral BID BM  . metoprolol  50 mg Oral BID    Conditions of Recording:  This is a 16 channel EEG carried out with the patient in the drowsy state.  Description:  The waking background activity consists predominantly of a low voltage, symmetrical, fairly well organized, theta activity, seen from the parieto-occipital and posterior temporal regions.  With arousal there are brief periods of non-sustained alpha activity. No focal slowing or epileptiform activity is noted.   Hyperventilation was not performed. Intermittent photic stimulation was not performed.  IMPRESSION: Abnormal EEG due to the presence of generalized slowing indicating a mild to moderate cerebral disturbance (encephalopathy). No epileptiform activity noted.    Jim Like, DO [facility] [phone]  If 7pm- 7am, please page neurology on call as listed in [place]. 09/15/2015, 1:31 PM

## 2015-09-16 DIAGNOSIS — R569 Unspecified convulsions: Secondary | ICD-10-CM

## 2015-09-16 DIAGNOSIS — T796XXA Traumatic ischemia of muscle, initial encounter: Secondary | ICD-10-CM

## 2015-09-16 DIAGNOSIS — S72142A Displaced intertrochanteric fracture of left femur, initial encounter for closed fracture: Principal | ICD-10-CM

## 2015-09-16 DIAGNOSIS — R413 Other amnesia: Secondary | ICD-10-CM

## 2015-09-16 DIAGNOSIS — I615 Nontraumatic intracerebral hemorrhage, intraventricular: Secondary | ICD-10-CM | POA: Insufficient documentation

## 2015-09-16 LAB — GLUCOSE, CAPILLARY
GLUCOSE-CAPILLARY: 164 mg/dL — AB (ref 65–99)
GLUCOSE-CAPILLARY: 120 mg/dL — AB (ref 65–99)
Glucose-Capillary: 171 mg/dL — ABNORMAL HIGH (ref 65–99)

## 2015-09-16 LAB — CBC
HEMATOCRIT: 31.2 % — AB (ref 36.0–46.0)
Hemoglobin: 9.7 g/dL — ABNORMAL LOW (ref 12.0–15.0)
MCH: 29.3 pg (ref 26.0–34.0)
MCHC: 31.1 g/dL (ref 30.0–36.0)
MCV: 94.3 fL (ref 78.0–100.0)
Platelets: 156 10*3/uL (ref 150–400)
RBC: 3.31 MIL/uL — ABNORMAL LOW (ref 3.87–5.11)
RDW: 15.3 % (ref 11.5–15.5)
WBC: 10.5 10*3/uL (ref 4.0–10.5)

## 2015-09-16 LAB — BASIC METABOLIC PANEL
ANION GAP: 3 — AB (ref 5–15)
BUN: 13 mg/dL (ref 6–20)
CALCIUM: 7.3 mg/dL — AB (ref 8.9–10.3)
CHLORIDE: 109 mmol/L (ref 101–111)
CO2: 26 mmol/L (ref 22–32)
Creatinine, Ser: 0.66 mg/dL (ref 0.44–1.00)
GFR calc Af Amer: >60 mL/min (ref >60)
GFR calc non Af Amer: >60 mL/min (ref >60)
GLUCOSE: 116 mg/dL — AB (ref 65–99)
POTASSIUM: 3.6 mmol/L (ref 3.5–5.1)
Sodium: 138 mmol/L (ref 135–145)

## 2015-09-16 LAB — MAGNESIUM: Magnesium: 2.1 mg/dL (ref 1.7–2.4)

## 2015-09-16 LAB — HEMOGLOBIN A1C
Hgb A1c MFr Bld: 6.3 % — ABNORMAL HIGH (ref 4.8–5.6)
MEAN PLASMA GLUCOSE: 134 mg/dL

## 2015-09-16 LAB — CK: Total CK: 861 U/L — ABNORMAL HIGH (ref 38–234)

## 2015-09-16 MED ORDER — IPRATROPIUM-ALBUTEROL 0.5-2.5 (3) MG/3ML IN SOLN
3.0000 mL | Freq: Three times a day (TID) | RESPIRATORY_TRACT | Status: DC
  Administered 2015-09-16 – 2015-09-17 (×5): 3 mL via RESPIRATORY_TRACT
  Filled 2015-09-16 (×4): qty 3

## 2015-09-16 MED ORDER — INSULIN ASPART 100 UNIT/ML ~~LOC~~ SOLN
0.0000 [IU] | Freq: Three times a day (TID) | SUBCUTANEOUS | Status: DC
  Administered 2015-09-17: 2 [IU] via SUBCUTANEOUS

## 2015-09-16 MED ORDER — ALBUTEROL SULFATE (2.5 MG/3ML) 0.083% IN NEBU: 2.5000 mg | INHALATION_SOLUTION | RESPIRATORY_TRACT | Status: DC | PRN

## 2015-09-16 MED ORDER — INSULIN ASPART 100 UNIT/ML ~~LOC~~ SOLN: 0.0000 [IU] | Freq: Every day | SUBCUTANEOUS | Status: DC

## 2015-09-16 NOTE — Progress Notes (Deleted)
Pt sitting in a chair, resting comfortable. I agree with previous nurse's assessment. Will continue to monitor pt.

## 2015-09-16 NOTE — Progress Notes (Signed)
Pt sitting in a chair, resting comfortable. I agree with previous nurse's assessment. Will continue to monitor pt.

## 2015-09-16 NOTE — Progress Notes (Signed)
PATIENT DETAILS Name: [name] Age: 81 y.o. Sex: female Date of Birth: 1925/04/30 Admit Date: 09/14/2015 Admitting Physician Debbe Odea, MD URK:YHCW, [name], MD  Subjective: Seems to be much more awake and alert compared to yesterday. Remains pleasantly confused-however follows most of my commands.  Assessment/Plan: Principal Problem: Displaced intertrochanteric fracture of left femur:Repaired with surgical fixation by orthopedics on 10/125/16. Suspect secondary to a mechanical fall from? Seizure. Doing well-on no pharmacologic DVT prophylaxis secondary to a small intraventricular hemorrhage. Orthopedics following-recommendations are for weightbearing as tolerated with a walker. Suspect will require SNF on discharge.  Active Problems: Intraventricular hemorrhage: Suspect traumatic-from? Seizure. CT head demonstrates small lateral intraventricular hemorrhage with stable ventricular . MRI/MRA confirms small intraventricular hemorrhage without any acute changes. No evidence of AVM or aneurysms. 2-D echocardiogram shows preserved ejection fraction without any embolic source. Carotid Doppler shows 40-59% stenosis on the right internal carotid artery-likely asymptomatic. Will need outpatient follow-up. Continue with supportive care and avoid antiplatelets/anticoagulation for now.  Mild rhabdomyolysis: Etiology likely secondary to fall and being down for a unknown period of time. CK levels down trending, supportive care.  History of dyslipidemia:Lipid panel shows total cholesterol 120, LDL 76. Not on medication prior to admission-we will start statins once CK has down trended further.  History of seizure disorder: EEG negative-given prior history of seizures-unexplained falls causing hip fracture and intraventricular hemorrhage-speaking to neurology-started on Keppra.  Anemia: Suspected perioperative blood loss anemia. Hemoglobin stable at 9.7. No indication for  transfusion. Follow periodically.  Hypertension:controlled-continue metoprolol. Follow  Prolonged QTC: Secondary to left bundle branch proximal-supportive care  Dementia with behavioral disturbance: Continue Aricept. Presently confused. Follows with [facility] neurology as outpatient.  Breast cancer of the upper-outer quadrant of right female breast:Continue Arimidex  Disposition: Remain inpatient-SNF on 10/28  Antimicrobial agents  See below  Anti-infectives    Start     Dose/Rate Route Frequency Ordered Stop   09/14/15 2100  ceFAZolin (ANCEF) IVPB 2 g/50 mL premix     2 g 100 mL/hr over 30 Minutes Intravenous Every 6 hours 09/14/15 1821 09/15/15 0325      DVT Prophylaxis:  SCD's  Code Status: DNR-confirmed with son today over the phone  Family Communication Son-Richard Legner-over the phone  Procedures: 10/25>>INTRAMEDULLARY (IM) NAIL INTERTROCHANTRIC (Left)  CONSULTS:  orthopedic surgery  Time spent 40 minutes-Greater than 50% of this time was spent in counseling, explanation of diagnosis, planning of further management, and coordination of care.  MEDICATIONS: Scheduled Meds: .  stroke: mapping our early stages of recovery book   Does not apply Once  . acetaminophen  1,000 mg Oral 3 times per day  . anastrozole  1 mg Oral Daily  . donepezil  10 mg Oral QHS  . feeding supplement (ENSURE ENLIVE)  237 mL Oral BID BM  . ipratropium-albuterol  3 mL Nebulization TID  . metoprolol  50 mg Oral BID  . polyethylene glycol  17 g Oral Daily   Continuous Infusions:  PRN Meds:.albuterol, LORazepam, menthol-cetylpyridinium **OR** phenol, metoCLOPramide **OR** metoCLOPramide (REGLAN) injection, morphine injection, oxyCODONE    PHYSICAL EXAM: Vital signs in last 24 hours: Filed Vitals:   09/15/15 2100 09/16/15 0443 09/16/15 0938 09/16/15 1001  BP: 154/60 144/55  143/68  Pulse: 108 92 91 99  Temp: 99.1 F (37.3 C) 97.7 F (36.5 C)  98.1 F (36.7 C)  TempSrc: Oral  Oral  Oral  Resp: 18 20 20  20  Height:      Weight:      SpO2: 100% 100% 100% 100%    Weight change:  Filed Weights   09/15/15 1409  Weight: 48.988 kg (108 lb)   Body mass index is 19.14 kg/(m^2).   Gen Exam: Awake and pleasantly confused-not in any distress. Neck: Supple, No JVD.   Chest: B/L Clear.  CVS: S1 S2 Regular, no murmurs.  Abdomen: soft, BS +, non tender, non distended.  Extremities: no edema, lower extremities warm to touch. Neurologic: Non Focal.   Skin: No Rash.   Wounds: N/A.    Intake/Output from previous day:  Intake/Output Summary (Last 24 hours) at 09/16/15 1112 Last data filed at 09/16/15 1018  Gross per 24 hour  Intake    480 ml  Output      0 ml  Net    480 ml     LAB RESULTS: CBC  Recent Labs Lab 09/14/15 0732 09/15/15 0626 09/16/15 0337  WBC 14.8* 11.9* 10.5  HGB 12.9 10.1* 9.7*  HCT 39.5 32.3* 31.2*  PLT 252 173 156  MCV 91.4 93.9 94.3  MCH 29.9 29.4 29.3  MCHC 32.7 31.3 31.1  RDW 14.6 15.1 15.3  LYMPHSABS 0.9  --   --   MONOABS 0.3  --   --   EOSABS 0.0  --   --   BASOSABS 0.0  --   --     Chemistries   Recent Labs Lab 09/14/15 0732 09/15/15 0626 09/16/15 0337  NA 139 137 138  K 4.0 3.7 3.6  CL 103 107 109  CO2 25 24 26   GLUCOSE 149* 112* 116*  BUN 17 12 13   CREATININE 0.94 0.79 0.66  CALCIUM 8.5* 7.1* 7.3*  MG  --   --  2.1    CBG: No results for input(s): GLUCAP in the last 168 hours.  GFR Estimated Creatinine Clearance: 37.6 mL/min (by C-G formula based on Cr of 0.66).  Coagulation profile No results for input(s): INR, PROTIME in the last 168 hours.  Cardiac Enzymes No results for input(s): CKMB, TROPONINI, MYOGLOBIN in the last 168 hours.  Invalid input(s): CK  Invalid input(s): POCBNP No results for input(s): DDIMER in the last 72 hours.  Recent Labs  09/15/15 0626  HGBA1C 6.3*    Recent Labs  09/15/15 0626  CHOL 120  HDL 36*  LDLCALC 76  TRIG 41  CHOLHDL 3.3   No results for  input(s): TSH, T4TOTAL, T3FREE, THYROIDAB in the last 72 hours.  Invalid input(s): FREET3 No results for input(s): VITAMINB12, FOLATE, FERRITIN, TIBC, IRON, RETICCTPCT in the last 72 hours. No results for input(s): LIPASE, AMYLASE in the last 72 hours.  Urine Studies No results for input(s): UHGB, CRYS in the last 72 hours.  Invalid input(s): UACOL, UAPR, USPG, UPH, UTP, UGL, UKET, UBIL, UNIT, UROB, ULEU, UEPI, UWBC, URBC, UBAC, CAST, UCOM, BILUA  MICROBIOLOGY: Recent Results (from the past 240 hour(s))  Urine culture     Status: None   Collection Time: 09/14/15 10:24 AM  Result Value Ref Range Status   Specimen Description URINE, CATHETERIZED  Final   Special Requests NONE  Final   Culture NO GROWTH 1 DAY  Final   Report Status 09/15/2015 FINAL  Final    RADIOLOGY STUDIES/RESULTS: Dg Chest 1 View  09/14/2015  CLINICAL DATA:  Injury post fall, left hip fracture EXAM: CHEST 1 VIEW COMPARISON:  03/10/2010 FINDINGS: Cardiomediastinal silhouette is stable. Hyperinflation and chronic interstitial prominence again noted. No  acute infiltrate or pulmonary edema. No segmental infiltrate. No pneumothorax. IMPRESSION: No active disease. Again noted hyperinflation and chronic interstitial prominence. Electronically Signed   By: Lahoma Crocker M.D.   On: 09/14/2015 08:03   Ct Head Wo Contrast  09/14/2015  CLINICAL DATA:  Unwitnessed fall EXAM: CT HEAD WITHOUT CONTRAST TECHNIQUE: Contiguous axial images were obtained from the base of the skull through the vertex without intravenous contrast. COMPARISON:  12/28/2013 FINDINGS: Skull and Sinuses:Negative for fracture or destructive process. The mastoids, middle ears, and imaged paranasal sinuses are clear. Orbits: No acute abnormality. Brain: Small volume acute intraventricular hemorrhage layering within the lateral ventricles bilaterally. Stable ventriculomegaly related to atrophy. No contusion or swelling noted. Age congruent small-vessel ischemic changes  in the deep cerebral white matter. No evidence of acute infarct, mass lesion, or shift. Linear high-density structure in the left parietal lobe consistent with developmental venous anomaly. Critical Value/emergent results were called by telephone at the time of interpretation on 09/14/2015 at 8:33 am to Dr. Blanchie Dessert , who verbally acknowledged these results. IMPRESSION: 1. Small lateral intraventricular hemorrhage with stable ventricular volume. 2. Stable chronic changes as described above. Electronically Signed   By: Monte Fantasia M.D.   On: 09/14/2015 08:36   Mr Jodene Nam Head Wo Contrast  09/15/2015  CLINICAL DATA:  Recent fall with hip fracture and bilateral intraventricular hemorrhage. EXAM: MRI HEAD WITHOUT CONTRAST MRA HEAD WITHOUT CONTRAST TECHNIQUE: Multiplanar, multiecho pulse sequences of the brain and surrounding structures were obtained without intravenous contrast. Angiographic images of the head were obtained using MRA technique without contrast. COMPARISON:  CT head without contrast 09/14/2015. FINDINGS: MRI HEAD FINDINGS A small amount of layering blood is noted in the posterior horns of the lateral ventricles bilaterally, not significantly changed in the interval. There is no hydrocephalus. No new areas of hemorrhage are present. Moderate atrophy and diffuse white matter disease is evident bilaterally. Remote lacunar infarcts are present within the cerebellum bilaterally. Dilated perivascular spaces are noted bilaterally. Flow is present in the major intracranial arteries bilaterally. No significant extra-axial collection is present. The globes and orbits are intact. There is mild mucosal thickening in the maxillary sinuses bilaterally. Scattered opacification of ethmoid air cells is noted. Right greater and left frontal sinus disease is noted. The sphenoid sinuses and mastoid air cells are clear. MRA HEAD FINDINGS Internal carotid arteries are within normal limits from the high cervical  segments through the ICA termini bilaterally. The A1 segments are normal bilaterally. The right M1 segment is normal. There is a moderate to high-grade stenosis in the distal left M1 segment. Distal MCA branch vessel attenuation is present bilaterally. There is some attenuation of distal ACA branch vessels as well. The left vertebral artery is the dominant vessel. The PICA origins are visualized and normal. There is mild narrowing of the distal left vertebral artery. The basilar artery is normal. There is moderate proximal PCA stenosis bilaterally. There is mild attenuation of distal PCA branch vessels bilaterally, right greater than left IMPRESSION: 1. Stable appearance of blood products layering posteriorly in the lateral ventricles bilaterally without hydrocephalus. 2. No other acute or chronic hemorrhage. 3. Moderate generalized atrophy and diffuse white matter disease compatible with chronic microvascular ischemia. 4. Moderate distal left M1 segment stenosis. 5. Moderate diffuse distal small vessel disease. 6. Mild distal left vertebral artery stenosis. 7. Moderate proximal PCA stenoses bilaterally. Electronically Signed   By: San Morelle M.D.   On: 09/15/2015 16:15   Mr Brain Wo Contrast  09/15/2015  CLINICAL DATA:  Recent fall with hip fracture and bilateral intraventricular hemorrhage. EXAM: MRI HEAD WITHOUT CONTRAST MRA HEAD WITHOUT CONTRAST TECHNIQUE: Multiplanar, multiecho pulse sequences of the brain and surrounding structures were obtained without intravenous contrast. Angiographic images of the head were obtained using MRA technique without contrast. COMPARISON:  CT head without contrast 09/14/2015. FINDINGS: MRI HEAD FINDINGS A small amount of layering blood is noted in the posterior horns of the lateral ventricles bilaterally, not significantly changed in the interval. There is no hydrocephalus. No new areas of hemorrhage are present. Moderate atrophy and diffuse white matter disease is  evident bilaterally. Remote lacunar infarcts are present within the cerebellum bilaterally. Dilated perivascular spaces are noted bilaterally. Flow is present in the major intracranial arteries bilaterally. No significant extra-axial collection is present. The globes and orbits are intact. There is mild mucosal thickening in the maxillary sinuses bilaterally. Scattered opacification of ethmoid air cells is noted. Right greater and left frontal sinus disease is noted. The sphenoid sinuses and mastoid air cells are clear. MRA HEAD FINDINGS Internal carotid arteries are within normal limits from the high cervical segments through the ICA termini bilaterally. The A1 segments are normal bilaterally. The right M1 segment is normal. There is a moderate to high-grade stenosis in the distal left M1 segment. Distal MCA branch vessel attenuation is present bilaterally. There is some attenuation of distal ACA branch vessels as well. The left vertebral artery is the dominant vessel. The PICA origins are visualized and normal. There is mild narrowing of the distal left vertebral artery. The basilar artery is normal. There is moderate proximal PCA stenosis bilaterally. There is mild attenuation of distal PCA branch vessels bilaterally, right greater than left IMPRESSION: 1. Stable appearance of blood products layering posteriorly in the lateral ventricles bilaterally without hydrocephalus. 2. No other acute or chronic hemorrhage. 3. Moderate generalized atrophy and diffuse white matter disease compatible with chronic microvascular ischemia. 4. Moderate distal left M1 segment stenosis. 5. Moderate diffuse distal small vessel disease. 6. Mild distal left vertebral artery stenosis. 7. Moderate proximal PCA stenoses bilaterally. Electronically Signed   By: San Morelle M.D.   On: 09/15/2015 16:15   Pelvis Portable  09/14/2015  CLINICAL DATA:  Status post left femur fracture ORIF EXAM: PORTABLE PELVIS 1-2 VIEWS COMPARISON:   Operative images from this same date FINDINGS: Intra medullary rod supports a compression screw which extends from the lateral proximal femur across the neck into the femoral head. Primary fracture components are in anatomic alignment on this single view. There is a nondisplaced fracture across the base of the lesser trochanter. There is no evidence of a new fracture or operative complication. IMPRESSION: Well aligned fracture following ORIF. Electronically Signed   By: Lajean Manes M.D.   On: 09/14/2015 17:49   Dg Knee Left Port  09/14/2015  CLINICAL DATA:  Fall, left knee pain EXAM: PORTABLE LEFT KNEE - 1-2 VIEW COMPARISON:  12/28/2013 FINDINGS: Two views of the left knee submitted. Mild narrowing of medial joint compartment. There is narrowing of patellofemoral joint space. No acute fracture or subluxation. Spurring of patella. IMPRESSION: No acute fracture or subluxation. Degenerative changes as described above. Electronically Signed   By: Lahoma Crocker M.D.   On: 09/14/2015 09:53   Dg C-arm 1-60 Min  09/14/2015  CLINICAL DATA:  ORIF left hip fracture. EXAM: DG C-ARM 61-120 MIN; LEFT FEMUR 2 VIEWS COMPARISON:  Left hip radiographs 09/14/2015. FINDINGS: ORIF is noted. An intra medullary rod is in place. A single  distal interlocking screw is noted. A dynamic hip screw is present across the intertrochanteric fracture. Fracture is reduced. IMPRESSION: ORIF of the left femur without radiographic evidence for complication. Electronically Signed   By: San Morelle M.D.   On: 09/14/2015 16:20   Dg Hip Unilat With Pelvis 2-3 Views Left  09/14/2015  CLINICAL DATA:  Fall with left hip fracture. Initial encounter. EXAM: DG HIP (WITH OR WITHOUT PELVIS) 2-3V LEFT COMPARISON:  None. FINDINGS: Acute intertrochanteric left femur fracture with apex ventral angulation and medial impaction. Both hips are located. No evidence of pelvic ring fracture or diastasis. Osteopenia and atherosclerosis. IMPRESSION:  Displaced intertrochanteric left femur fracture. Electronically Signed   By: Monte Fantasia M.D.   On: 09/14/2015 08:08   Dg Femur Min 2 Views Left  09/14/2015  CLINICAL DATA:  Status post left-sided IM femoral nail. EXAM: LEFT FEMUR 2 VIEWS COMPARISON:  Fluoroscopic images from earlier same day. FINDINGS: Postoperative film of the left femur shows appropriate positioning of the intra medullary rod. The dynamic hip screw traverses the fracture site and also appears well positioned. A single distal interlocking screw appears intact and well positioned. No surgical complicating features seen. IMPRESSION: Internal fixation of the left femur fracture without evidence of complicating feature. Electronically Signed   By: Franki Cabot M.D.   On: 09/14/2015 17:51   Dg Femur Min 2 Views Left  09/14/2015  CLINICAL DATA:  ORIF left hip fracture. EXAM: DG C-ARM 61-120 MIN; LEFT FEMUR 2 VIEWS COMPARISON:  Left hip radiographs 09/14/2015. FINDINGS: ORIF is noted. An intra medullary rod is in place. A single distal interlocking screw is noted. A dynamic hip screw is present across the intertrochanteric fracture. Fracture is reduced. IMPRESSION: ORIF of the left femur without radiographic evidence for complication. Electronically Signed   By: San Morelle M.D.   On: 09/14/2015 16:20    Oren Binet, MD  [facility] Pager:[phone]  If 7PM-7AM, please contact night-coverage [email or web address] Password [email or web address] 09/16/2015, 11:12 AM   LOS: 2 days

## 2015-09-16 NOTE — Progress Notes (Signed)
Subjective: Patient awake and alert.  No complaints of focal weakness   Objective: Current vital signs: BP 143/68 mmHg  Pulse 99  Temp(Src) 98.1 F (36.7 C) (Oral)  Resp 20  Ht 5\' 3"  (1.6 m)  Wt 48.988 kg (108 lb)  BMI 19.14 kg/m2  SpO2 100% Vital signs in last 24 hours: Temp:  [97.7 F (36.5 C)-99.7 F (37.6 C)] 98.1 F (36.7 C) (10/27 1001) Pulse Rate:  [91-108] 99 (10/27 1001) Resp:  [15-20] 20 (10/27 1001) BP: (136-154)/(55-68) 143/68 mmHg (10/27 1001) SpO2:  [98 %-100 %] 100 % (10/27 1001) Weight:  [48.988 kg (108 lb)] 48.988 kg (108 lb) (10/26 1409)  Intake/Output from previous day:   Intake/Output this shift: Total I/O In: 480 [P.O.:480] Out: -  Nutritional status: Diet clear liquid Room service appropriate?: Yes; Fluid consistency:: Thin  Neurologic Exam: Mental Status: Alert, not oriented, does not recall surgery or the fact she is in the hospital.  Speech fluent without evidence of aphasia. Able to follow simple commands without difficulty. Cranial Nerves: II: Legally blind.  Pupils equal, round, reactive to light and accommodation III,IV, VI: ptosis not present, extra-ocular motions intact bilaterally V,VII: smile symmetric, facial light touch sensation normal bilaterally VIII: hearing normal bilaterally IX,X: uvula rises symmetrically XI: bilateral shoulder shrug XII: midline tongue extension Motor: Right :Upper extremity 5/5Left: Upper extremity 5/5  Lab Results: Basic Metabolic Panel:  Recent Labs Lab 09/14/15 0732 09/15/15 0626 09/16/15 0337  NA 139 137 138  K 4.0 3.7 3.6  CL 103 107 109  CO2 25 24 26   GLUCOSE 149* 112* 116*  BUN 17 12 13   CREATININE 0.94 0.79 0.66  CALCIUM 8.5* 7.1* 7.3*  MG  --   --  2.1    Liver Function Tests:  Recent Labs Lab 09/14/15 0732  AST 36  ALT 16  ALKPHOS 69  BILITOT 1.3*  PROT 6.4*  ALBUMIN 3.3*   No results for input(s): LIPASE, AMYLASE in  the last 168 hours. No results for input(s): AMMONIA in the last 168 hours.  CBC:  Recent Labs Lab 09/14/15 0732 09/15/15 0626 09/16/15 0337  WBC 14.8* 11.9* 10.5  NEUTROABS 13.6*  --   --   HGB 12.9 10.1* 9.7*  HCT 39.5 32.3* 31.2*  MCV 91.4 93.9 94.3  PLT 252 173 156    Cardiac Enzymes:  Recent Labs Lab 09/14/15 0732 09/14/15 1842 09/15/15 0626 09/16/15 0337  CKTOTAL 302* 1993* 1970* 861*    Lipid Panel:  Recent Labs Lab 09/15/15 0626  CHOL 120  TRIG 41  HDL 36*  CHOLHDL 3.3  VLDL 8  LDLCALC 76    CBG: No results for input(s): GLUCAP in the last 168 hours.  Microbiology: Results for orders placed or performed during the hospital encounter of 09/14/15  Urine culture     Status: None   Collection Time: 09/14/15 10:24 AM  Result Value Ref Range Status   Specimen Description URINE, CATHETERIZED  Final   Special Requests NONE  Final   Culture NO GROWTH 1 DAY  Final   Report Status 09/15/2015 FINAL  Final    Coagulation Studies: No results for input(s): LABPROT, INR in the last 72 hours.  Imaging: Mr Virgel Paling Wo Contrast  09/15/2015  CLINICAL DATA:  Recent fall with hip fracture and bilateral intraventricular hemorrhage. EXAM: MRI HEAD WITHOUT CONTRAST MRA HEAD WITHOUT CONTRAST TECHNIQUE: Multiplanar, multiecho pulse sequences of the brain and surrounding structures were obtained without intravenous contrast. Angiographic images of the head  were obtained using MRA technique without contrast. COMPARISON:  CT head without contrast 09/14/2015. FINDINGS: MRI HEAD FINDINGS A small amount of layering blood is noted in the posterior horns of the lateral ventricles bilaterally, not significantly changed in the interval. There is no hydrocephalus. No new areas of hemorrhage are present. Moderate atrophy and diffuse white matter disease is evident bilaterally. Remote lacunar infarcts are present within the cerebellum bilaterally. Dilated perivascular spaces are noted  bilaterally. Flow is present in the major intracranial arteries bilaterally. No significant extra-axial collection is present. The globes and orbits are intact. There is mild mucosal thickening in the maxillary sinuses bilaterally. Scattered opacification of ethmoid air cells is noted. Right greater and left frontal sinus disease is noted. The sphenoid sinuses and mastoid air cells are clear. MRA HEAD FINDINGS Internal carotid arteries are within normal limits from the high cervical segments through the ICA termini bilaterally. The A1 segments are normal bilaterally. The right M1 segment is normal. There is a moderate to high-grade stenosis in the distal left M1 segment. Distal MCA branch vessel attenuation is present bilaterally. There is some attenuation of distal ACA branch vessels as well. The left vertebral artery is the dominant vessel. The PICA origins are visualized and normal. There is mild narrowing of the distal left vertebral artery. The basilar artery is normal. There is moderate proximal PCA stenosis bilaterally. There is mild attenuation of distal PCA branch vessels bilaterally, right greater than left IMPRESSION: 1. Stable appearance of blood products layering posteriorly in the lateral ventricles bilaterally without hydrocephalus. 2. No other acute or chronic hemorrhage. 3. Moderate generalized atrophy and diffuse white matter disease compatible with chronic microvascular ischemia. 4. Moderate distal left M1 segment stenosis. 5. Moderate diffuse distal small vessel disease. 6. Mild distal left vertebral artery stenosis. 7. Moderate proximal PCA stenoses bilaterally. Electronically Signed   By: San Morelle M.D.   On: 09/15/2015 16:15   Mr Brain Wo Contrast  09/15/2015  CLINICAL DATA:  Recent fall with hip fracture and bilateral intraventricular hemorrhage. EXAM: MRI HEAD WITHOUT CONTRAST MRA HEAD WITHOUT CONTRAST TECHNIQUE: Multiplanar, multiecho pulse sequences of the brain and  surrounding structures were obtained without intravenous contrast. Angiographic images of the head were obtained using MRA technique without contrast. COMPARISON:  CT head without contrast 09/14/2015. FINDINGS: MRI HEAD FINDINGS A small amount of layering blood is noted in the posterior horns of the lateral ventricles bilaterally, not significantly changed in the interval. There is no hydrocephalus. No new areas of hemorrhage are present. Moderate atrophy and diffuse white matter disease is evident bilaterally. Remote lacunar infarcts are present within the cerebellum bilaterally. Dilated perivascular spaces are noted bilaterally. Flow is present in the major intracranial arteries bilaterally. No significant extra-axial collection is present. The globes and orbits are intact. There is mild mucosal thickening in the maxillary sinuses bilaterally. Scattered opacification of ethmoid air cells is noted. Right greater and left frontal sinus disease is noted. The sphenoid sinuses and mastoid air cells are clear. MRA HEAD FINDINGS Internal carotid arteries are within normal limits from the high cervical segments through the ICA termini bilaterally. The A1 segments are normal bilaterally. The right M1 segment is normal. There is a moderate to high-grade stenosis in the distal left M1 segment. Distal MCA branch vessel attenuation is present bilaterally. There is some attenuation of distal ACA branch vessels as well. The left vertebral artery is the dominant vessel. The PICA origins are visualized and normal. There is mild narrowing of the distal  left vertebral artery. The basilar artery is normal. There is moderate proximal PCA stenosis bilaterally. There is mild attenuation of distal PCA branch vessels bilaterally, right greater than left IMPRESSION: 1. Stable appearance of blood products layering posteriorly in the lateral ventricles bilaterally without hydrocephalus. 2. No other acute or chronic hemorrhage. 3. Moderate  generalized atrophy and diffuse white matter disease compatible with chronic microvascular ischemia. 4. Moderate distal left M1 segment stenosis. 5. Moderate diffuse distal small vessel disease. 6. Mild distal left vertebral artery stenosis. 7. Moderate proximal PCA stenoses bilaterally. Electronically Signed   By: San Morelle M.D.   On: 09/15/2015 16:15   Pelvis Portable  09/14/2015  CLINICAL DATA:  Status post left femur fracture ORIF EXAM: PORTABLE PELVIS 1-2 VIEWS COMPARISON:  Operative images from this same date FINDINGS: Intra medullary rod supports a compression screw which extends from the lateral proximal femur across the neck into the femoral head. Primary fracture components are in anatomic alignment on this single view. There is a nondisplaced fracture across the base of the lesser trochanter. There is no evidence of a new fracture or operative complication. IMPRESSION: Well aligned fracture following ORIF. Electronically Signed   By: Lajean Manes M.D.   On: 09/14/2015 17:49   Dg C-arm 1-60 Min  09/14/2015  CLINICAL DATA:  ORIF left hip fracture. EXAM: DG C-ARM 61-120 MIN; LEFT FEMUR 2 VIEWS COMPARISON:  Left hip radiographs 09/14/2015. FINDINGS: ORIF is noted. An intra medullary rod is in place. A single distal interlocking screw is noted. A dynamic hip screw is present across the intertrochanteric fracture. Fracture is reduced. IMPRESSION: ORIF of the left femur without radiographic evidence for complication. Electronically Signed   By: San Morelle M.D.   On: 09/14/2015 16:20   Dg Femur Min 2 Views Left  09/14/2015  CLINICAL DATA:  Status post left-sided IM femoral nail. EXAM: LEFT FEMUR 2 VIEWS COMPARISON:  Fluoroscopic images from earlier same day. FINDINGS: Postoperative film of the left femur shows appropriate positioning of the intra medullary rod. The dynamic hip screw traverses the fracture site and also appears well positioned. A single distal interlocking screw  appears intact and well positioned. No surgical complicating features seen. IMPRESSION: Internal fixation of the left femur fracture without evidence of complicating feature. Electronically Signed   By: Franki Cabot M.D.   On: 09/14/2015 17:51   Dg Femur Min 2 Views Left  09/14/2015  CLINICAL DATA:  ORIF left hip fracture. EXAM: DG C-ARM 61-120 MIN; LEFT FEMUR 2 VIEWS COMPARISON:  Left hip radiographs 09/14/2015. FINDINGS: ORIF is noted. An intra medullary rod is in place. A single distal interlocking screw is noted. A dynamic hip screw is present across the intertrochanteric fracture. Fracture is reduced. IMPRESSION: ORIF of the left femur without radiographic evidence for complication. Electronically Signed   By: San Morelle M.D.   On: 09/14/2015 16:20    Medications:  I have reviewed the patient's current medications. Scheduled: .  stroke: mapping our early stages of recovery book   Does not apply Once  . acetaminophen  1,000 mg Oral 3 times per day  . anastrozole  1 mg Oral Daily  . donepezil  10 mg Oral QHS  . feeding supplement (ENSURE ENLIVE)  237 mL Oral BID BM  . ipratropium-albuterol  3 mL Nebulization TID  . metoprolol  50 mg Oral BID  . polyethylene glycol  17 g Oral Daily    Assessment/Plan: Based on presentation and history of seizures concern is for  seizure precipitating hospitalization despite negative work up.  EEG only significant for generalized slowing.  MRI of the brain personally reviewed and shows no acute changes.  Intraventricular hemorrhage unchanged.  No evidence of AVM or metastasis.    Recommendations: 1.  Keppra 500mg  BID.  No load required. 2.  Will continue to follow with you.     LOS: 2 days   Alexis Goodell, MD [facility] [phone] 09/16/2015  10:30 AM

## 2015-09-16 NOTE — Progress Notes (Signed)
Physical Therapy Treatment Patient Details Name: [name] MRN: [id number] DOB: 23-Aug-1925 Today's Date: 09/16/2015    History of Present Illness Pt is an 81 y/o female who was found outside her apartment on the ground having sustained a L hip fx and small R intraventricular hemorrage. Underwent IM nailing of L hip on 09/14/15. Pt with PMH: macular degeneration, dementia, CAD, pulm hypertension, syncope, and breast cancer.    PT Comments    Pt progressing towards physical therapy goals. Was able to transition bed>chair with +2 assist and increased cueing for safety/technqiue. Pt continues to be confused as to circumstances of her admission, and cannot remember why her L leg hurts, even after reminded that she has had surgery. Will continue to follow.   Follow Up Recommendations  SNF;Supervision/Assistance - 24 hour     Equipment Recommendations  Rolling walker with 5" wheels;3in1 (PT)    Recommendations for Other Services       Precautions / Restrictions Precautions Precautions: Fall Precaution Comments: Macular degeneration - vision is very low Restrictions Weight Bearing Restrictions: Yes LLE Weight Bearing: Weight bearing as tolerated    Mobility  Bed Mobility Overal bed mobility: +2 for physical assistance;Needs Assistance Bed Mobility: Rolling;Sidelying to Sit Rolling: Max assist Sidelying to sit: Mod assist;+2 for physical assistance       General bed mobility comments: Step by step cues and hand-over-hand assist to reach for the rail. Assist was provided for LE movement and trunk elevation. Pt able to scoot hips forward and back with use of pad  Transfers Overall transfer level: Needs assistance Equipment used: 2 person hand held assist Transfers: Sit to/from Stand Sit to Stand: +2 physical assistance;Max assist         General transfer comment: VC's for hand placement on seated surface for safety. Pt was able to power-up to full standing with +2 assist  and take small pivotal steps around to the recliner.  Ambulation/Gait             General Gait Details: Unable at this time.    Stairs            Wheelchair Mobility    Modified Rankin (Stroke Patients Only) Modified Rankin (Stroke Patients Only) Pre-Morbid Rankin Score: No significant disability Modified Rankin: Severe disability     Balance Overall balance assessment: Needs assistance Sitting-balance support: Feet supported;No upper extremity supported Sitting balance-Leahy Scale: Fair Sitting balance - Comments: once she gained confidence, able to sit EOB with close guard   Standing balance support: Bilateral upper extremity supported;During functional activity Standing balance-Leahy Scale: Poor                      Cognition Arousal/Alertness: Awake/alert Behavior During Therapy: Anxious Overall Cognitive Status: History of cognitive impairments - at baseline       Memory: Decreased short-term memory (Pt does not recall why her leg hurts)              Exercises General Exercises - Lower Extremity Ankle Circles/Pumps: 15 reps Heel Slides: AAROM;5 reps    General Comments        Pertinent Vitals/Pain Pain Assessment: Faces Faces Pain Scale: Hurts even more Pain Location: L hip with mobility Pain Descriptors / Indicators: Operative site guarding Pain Intervention(s): Limited activity within patient's tolerance;Monitored during session;Repositioned    Home Living                      Prior Function  PT Goals (current goals can now be found in the care plan section) Acute Rehab PT Goals Patient Stated Goal: Decrease pain PT Goal Formulation: With patient Time For Goal Achievement: 09/22/15 Potential to Achieve Goals: Good Progress towards PT goals: Progressing toward goals    Frequency  Min 3X/week    PT Plan Current plan remains appropriate    Co-evaluation             End of Session Equipment  Utilized During Treatment: Gait belt Activity Tolerance: Patient limited by pain Patient left: in chair;with call bell/phone within reach     Time: 0911-0934 PT Time Calculation (min) (ACUTE ONLY): 23 min  Charges:  $Gait Training: 8-22 mins $Therapeutic Activity: 8-22 mins                    G Codes:      Rolinda Roan 09-22-2015, 2:42 PM   Rolinda Roan, PT, DPT Acute Rehabilitation Services Pager: [phone]

## 2015-09-16 NOTE — Clinical Social Work Note (Signed)
CSW Intern spoke with Ivin Booty from admissions at [facility] regarding Ms. Botting's placement at their facility. Ivin Booty confirmed that the patient had a bed ready once she was ready for discharge. Ms. Ivin Booty also stated that she would contact the patient's son Delfino Lovett to set up an appointment to complete admissions paperwork tomorrow.

## 2015-09-16 NOTE — Progress Notes (Signed)
Speech Language Pathology Treatment: Dysphagia  Patient Details Name: [name] MRN: [id number] DOB: 1924/12/13 Today's Date: 09/16/2015 Time: 7169-6789 SLP Time Calculation (min) (ACUTE ONLY): 13 min  Assessment / Plan / Recommendation Clinical Impression  Per RN, pt cleared for more advanced solids this morning. SLP provided trials of regular textures and thin liquids. Airway protection appears adequate, although she does have slow mastication and subjective c/o difficulty clearing solids from her mouth. Min cues provided for clearance of oral residue that coated pt's tongue. Will advance diet to Dys 3 textures and thin liquids with f/u for tolerance.   HPI Other Pertinent Information: 81 year old female patient who lives alone but apparently does have a care provider name [name] who works with the patient regularly. She has a past medical history of dyslipidemia, hypertension and breast cancer on oral medications as well as a history of memory loss and prior visual hallucinations. 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 She was sent to the hospital after being found down outside her home on the concrete sidewalk for an unknown period of time.   Pertinent Vitals Pain Assessment: No/denies pain  SLP Plan  Continue with current plan of care    Recommendations Diet recommendations: Dysphagia 3 (mechanical soft);Thin liquid Liquids provided via: Straw;Cup Medication Administration: Whole meds with puree Supervision: Patient able to self feed;Full supervision/cueing for compensatory strategies Compensations: Slow rate;Small sips/bites Postural Changes and/or Swallow Maneuvers: Seated upright 90 degrees       Oral Care Recommendations: Oral care BID Follow up Recommendations: None Plan: Continue with current plan of care    Germain Osgood, M.A. CCC-SLP [phone]  Germain Osgood 09/16/2015, 11:09 AM

## 2015-09-17 DIAGNOSIS — F0391 Unspecified dementia with behavioral disturbance: Secondary | ICD-10-CM

## 2015-09-17 DIAGNOSIS — E785 Hyperlipidemia, unspecified: Secondary | ICD-10-CM

## 2015-09-17 DIAGNOSIS — I10 Essential (primary) hypertension: Secondary | ICD-10-CM

## 2015-09-17 LAB — CBC
HCT: 29.4 % — ABNORMAL LOW (ref 36.0–46.0)
Hemoglobin: 9.6 g/dL — ABNORMAL LOW (ref 12.0–15.0)
MCH: 30.5 pg (ref 26.0–34.0)
MCHC: 32.7 g/dL (ref 30.0–36.0)
MCV: 93.3 fL (ref 78.0–100.0)
PLATELETS: 168 10*3/uL (ref 150–400)
RBC: 3.15 MIL/uL — ABNORMAL LOW (ref 3.87–5.11)
RDW: 15.2 % (ref 11.5–15.5)
WBC: 8.9 10*3/uL (ref 4.0–10.5)

## 2015-09-17 LAB — GLUCOSE, CAPILLARY
GLUCOSE-CAPILLARY: 107 mg/dL — AB (ref 65–99)
Glucose-Capillary: 158 mg/dL — ABNORMAL HIGH (ref 65–99)

## 2015-09-17 MED ORDER — ALBUTEROL SULFATE (2.5 MG/3ML) 0.083% IN NEBU: 2.5000 mg | INHALATION_SOLUTION | RESPIRATORY_TRACT | Status: AC | PRN

## 2015-09-17 MED ORDER — IPRATROPIUM-ALBUTEROL 0.5-2.5 (3) MG/3ML IN SOLN: 3.0000 mL | Freq: Three times a day (TID) | RESPIRATORY_TRACT | Status: AC

## 2015-09-17 MED ORDER — POLYETHYLENE GLYCOL 3350 17 G PO PACK: 17.0000 g | PACK | Freq: Every day | ORAL | Status: AC

## 2015-09-17 MED ORDER — LEVETIRACETAM 500 MG PO TABS
500.0000 mg | ORAL_TABLET | Freq: Two times a day (BID) | ORAL | Status: DC
  Administered 2015-09-17: 500 mg via ORAL
  Filled 2015-09-17: qty 1

## 2015-09-17 MED ORDER — ENSURE ENLIVE PO LIQD: 237.0000 mL | Freq: Two times a day (BID) | ORAL | Status: DC

## 2015-09-17 MED ORDER — LEVETIRACETAM 500 MG PO TABS: 500.0000 mg | ORAL_TABLET | Freq: Two times a day (BID) | ORAL | Status: DC

## 2015-09-17 MED ORDER — ACETAMINOPHEN 500 MG PO TABS: 1000.0000 mg | ORAL_TABLET | Freq: Three times a day (TID) | ORAL | Status: AC

## 2015-09-17 MED ORDER — OXYCODONE HCL 5 MG PO TABS: 5.0000 mg | ORAL_TABLET | Freq: Four times a day (QID) | ORAL | Status: AC | PRN

## 2015-09-17 NOTE — Care Management Note (Signed)
Case Management Note  Patient Details  Name: Chavela Justiniano MRN: [id number] Date of Birth: 1925-09-18  Subjective/Objective:                    Action/Plan: Plan is for patient to be discharged to SNF today. No further needs per CM.   Expected Discharge Date:                  Expected Discharge Plan:     In-House Referral:     Discharge planning Services     Post Acute Care Choice:    Choice offered to:     DME Arranged:    DME Agency:     HH Arranged:    HH Agency:     Status of Service:  In process, will continue to follow  Medicare Important Message Given:    Date Medicare IM Given:    Medicare IM give by:    Date Additional Medicare IM Given:    Additional Medicare Important Message give by:     If discussed at [place] of Stay Meetings, dates discussed:    Additional Comments:  Pollie Friar, RN 09/17/2015, 11:05 AM

## 2015-09-17 NOTE — Discharge Summary (Signed)
PATIENT DETAILS Name: [name] Age: 81 y.o. Sex: female Date of Birth: September 05, 1925 MRN: [id number]. Admitting Physician: Debbe Odea, MD MWN:[id number], [name], MD  Admit Date: 09/14/2015 Discharge date: 09/17/2015  Recommendations for Outpatient Follow-up:  1. Please ensure follow-up with orthopedics and neurology as outpatient  2. Please repeat CBC/BMET/creatinine kinase in 1 week 3. Avoid antiplatelets and anticoagulation-small intraventricular hemorrhage-till seen by neurology and cleared for such agents 4. Consider palliative care eval while at SNF  PRIMARY DISCHARGE DIAGNOSIS:  Principal Problem:   Displaced intertrochanteric fracture of left femur (HCC) Active Problems:   Breast cancer of upper-outer quadrant of right female breast (HCC)   Memory loss   Visual hallucination (history of)   Osteopenia   Intraventricular hemorrhage (HCC)   HTN (hypertension)   Dyslipidemia   Rhabdomyolysis   Elevated lactic acid level   Closed pertrochanteric fracture (HCC)   Delirium   Dementia with behavioral disturbance   Essential hypertension   IVH (intraventricular hemorrhage) (HCC)      PAST MEDICAL HISTORY: Past Medical History  Diagnosis Date  . Cancer (HCC)     breast  . Psoriasis   . Legally blind     macular degeneration  . Hypertension   . CAD (coronary artery disease) 2007    Last cath 2007, Last echo-09/01/10 EF >53%, RV Systolic [id number] mmHg;Last Nuc 2006 no ischemia  . Pulmonary HTN (HCC)     mild  . Seizure disorder ([place])        . H/O syncope     remotely, no etiology found, mild carotid disease on doppler, 0-49% bil  . Full dentures   . Wears glasses   . Lives in independent group home     [facility]  . Memory deficit   . Seasonal allergies   . Hallucinations   . Cataracts, bilateral   . Macular degeneration     DISCHARGE MEDICATIONS: Current Discharge Medication List    START taking these medications   Details   acetaminophen (TYLENOL) 500 MG tablet Take 2 tablets (1,000 mg total) by mouth every 8 (eight) hours. For one more week and then change to prn    albuterol (PROVENTIL) (2.5 MG/3ML) 0.083% nebulizer solution Take 3 mLs (2.5 mg total) by nebulization every 2 (two) hours as needed for shortness of breath.    feeding supplement, ENSURE ENLIVE, (ENSURE ENLIVE) LIQD Take 237 mLs by mouth 2 (two) times daily between meals.    ipratropium-albuterol (DUONEB) 0.5-2.5 (3) MG/3ML SOLN Take 3 mLs by nebulization 3 (three) times daily.    levETIRAcetam (KEPPRA) 500 MG tablet Take 1 tablet (500 mg total) by mouth 2 (two) times daily.    oxyCODONE (OXY IR/ROXICODONE) 5 MG immediate release tablet Take 1 tablet (5 mg total) by mouth every 6 (six) hours as needed for moderate pain. Qty: 30 tablet, Refills: 0    polyethylene glycol (MIRALAX / GLYCOLAX) packet Take 17 g by mouth daily.      CONTINUE these medications which have NOT CHANGED   Details  anastrozole (ARIMIDEX) 1 MG tablet Take 1 tablet (1 mg total) by mouth daily. Qty: 90 tablet, Refills: 5    donepezil (ARICEPT) 10 MG tablet Take 1 tablet (10 mg total) by mouth at bedtime. Qty: 30 tablet, Refills: 6    fexofenadine (ALLEGRA ALLERGY) 60 MG tablet Take 1 tablet (60 mg total) by mouth 2 (two) times daily.    metoprolol (LOPRESSOR) 50 MG tablet Take 50 mg by mouth 2 (two) times  daily.         ALLERGIES:   Allergies  Allergen Reactions  . Ephedra [Ephedrine] Other (See Comments)    unknown  . Captopril Rash  . 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 She was admitted for further evaluation and treatment  CONSULTATIONS:   neurology and orthopedic surgery  PERTINENT  RADIOLOGIC STUDIES: Dg Chest 1 View  09/14/2015  CLINICAL DATA:  Injury post fall, left hip fracture EXAM: CHEST 1 VIEW COMPARISON:  03/10/2010 FINDINGS: Cardiomediastinal silhouette is stable. Hyperinflation and chronic interstitial prominence again noted. No acute infiltrate or pulmonary edema. No segmental infiltrate. No pneumothorax. IMPRESSION: No active disease. Again noted hyperinflation and chronic interstitial prominence. Electronically Signed   By: Lahoma Crocker M.D.   On: 09/14/2015 08:03   Ct Head Wo Contrast  09/14/2015  CLINICAL DATA:  Unwitnessed fall EXAM: CT HEAD WITHOUT CONTRAST TECHNIQUE: Contiguous axial images were obtained from the base of the skull through the vertex without intravenous contrast. COMPARISON:  12/28/2013 FINDINGS: Skull and Sinuses:Negative for fracture or destructive process. The mastoids, middle ears, and imaged paranasal sinuses are clear. Orbits: No acute abnormality. Brain: Small volume acute intraventricular hemorrhage layering within the lateral ventricles bilaterally. Stable ventriculomegaly related to atrophy. No contusion or swelling noted. Age congruent small-vessel ischemic changes in the deep cerebral white matter. No evidence of acute infarct, mass lesion, or shift. Linear high-density structure in the left parietal lobe consistent with developmental venous anomaly. Critical Value/emergent results were called by telephone at the time of interpretation on 09/14/2015 at 8:33 am to Dr. Blanchie Dessert , who verbally acknowledged these results. IMPRESSION: 1. Small lateral intraventricular hemorrhage with stable ventricular volume. 2. Stable chronic changes as described above. Electronically Signed   By: Monte Fantasia M.D.   On: 09/14/2015 08:36   Mr Jodene Nam Head Wo Contrast  09/15/2015  CLINICAL DATA:  Recent fall with hip fracture and bilateral intraventricular hemorrhage. EXAM: MRI HEAD WITHOUT CONTRAST MRA HEAD WITHOUT CONTRAST TECHNIQUE: Multiplanar,  multiecho pulse sequences of the brain and surrounding structures were obtained without intravenous contrast. Angiographic images of the head were obtained using MRA technique without contrast. COMPARISON:  CT head without contrast 09/14/2015. FINDINGS: MRI HEAD FINDINGS A small amount of layering blood is noted in the posterior horns of the lateral ventricles bilaterally, not significantly changed in the interval. There is no hydrocephalus. No new areas of hemorrhage are present. Moderate atrophy and diffuse white matter disease is evident bilaterally. Remote lacunar infarcts are present within the cerebellum bilaterally. Dilated perivascular spaces are noted bilaterally. Flow is present in the major intracranial arteries bilaterally. No significant extra-axial collection is present. The globes and orbits are intact. There is mild mucosal thickening in the maxillary sinuses bilaterally. Scattered opacification of ethmoid air cells is noted. Right greater and left frontal sinus disease is noted. The sphenoid sinuses and mastoid air cells are clear. MRA HEAD FINDINGS Internal carotid arteries are within normal limits from the high cervical segments through the ICA termini bilaterally. The A1 segments are normal bilaterally. The right M1 segment is normal. There is a moderate to high-grade stenosis in the distal left M1 segment. Distal MCA branch vessel attenuation is present bilaterally. There is some attenuation of distal ACA branch vessels  as well. The left vertebral artery is the dominant vessel. The PICA origins are visualized and normal. There is mild narrowing of the distal left vertebral artery. The basilar artery is normal. There is moderate proximal PCA stenosis bilaterally. There is mild attenuation of distal PCA branch vessels bilaterally, right greater than left IMPRESSION: 1. Stable appearance of blood products layering posteriorly in the lateral ventricles bilaterally without hydrocephalus. 2. No other  acute or chronic hemorrhage. 3. Moderate generalized atrophy and diffuse white matter disease compatible with chronic microvascular ischemia. 4. Moderate distal left M1 segment stenosis. 5. Moderate diffuse distal small vessel disease. 6. Mild distal left vertebral artery stenosis. 7. Moderate proximal PCA stenoses bilaterally. Electronically Signed   By: San Morelle M.D.   On: 09/15/2015 16:15   Mr Brain Wo Contrast  09/15/2015  CLINICAL DATA:  Recent fall with hip fracture and bilateral intraventricular hemorrhage. EXAM: MRI HEAD WITHOUT CONTRAST MRA HEAD WITHOUT CONTRAST TECHNIQUE: Multiplanar, multiecho pulse sequences of the brain and surrounding structures were obtained without intravenous contrast. Angiographic images of the head were obtained using MRA technique without contrast. COMPARISON:  CT head without contrast 09/14/2015. FINDINGS: MRI HEAD FINDINGS A small amount of layering blood is noted in the posterior horns of the lateral ventricles bilaterally, not significantly changed in the interval. There is no hydrocephalus. No new areas of hemorrhage are present. Moderate atrophy and diffuse white matter disease is evident bilaterally. Remote lacunar infarcts are present within the cerebellum bilaterally. Dilated perivascular spaces are noted bilaterally. Flow is present in the major intracranial arteries bilaterally. No significant extra-axial collection is present. The globes and orbits are intact. There is mild mucosal thickening in the maxillary sinuses bilaterally. Scattered opacification of ethmoid air cells is noted. Right greater and left frontal sinus disease is noted. The sphenoid sinuses and mastoid air cells are clear. MRA HEAD FINDINGS Internal carotid arteries are within normal limits from the high cervical segments through the ICA termini bilaterally. The A1 segments are normal bilaterally. The right M1 segment is normal. There is a moderate to high-grade stenosis in the distal  left M1 segment. Distal MCA branch vessel attenuation is present bilaterally. There is some attenuation of distal ACA branch vessels as well. The left vertebral artery is the dominant vessel. The PICA origins are visualized and normal. There is mild narrowing of the distal left vertebral artery. The basilar artery is normal. There is moderate proximal PCA stenosis bilaterally. There is mild attenuation of distal PCA branch vessels bilaterally, right greater than left IMPRESSION: 1. Stable appearance of blood products layering posteriorly in the lateral ventricles bilaterally without hydrocephalus. 2. No other acute or chronic hemorrhage. 3. Moderate generalized atrophy and diffuse white matter disease compatible with chronic microvascular ischemia. 4. Moderate distal left M1 segment stenosis. 5. Moderate diffuse distal small vessel disease. 6. Mild distal left vertebral artery stenosis. 7. Moderate proximal PCA stenoses bilaterally. Electronically Signed   By: San Morelle M.D.   On: 09/15/2015 16:15   Pelvis Portable  09/14/2015  CLINICAL DATA:  Status post left femur fracture ORIF EXAM: PORTABLE PELVIS 1-2 VIEWS COMPARISON:  Operative images from this same date FINDINGS: Intra medullary rod supports a compression screw which extends from the lateral proximal femur across the neck into the femoral head. Primary fracture components are in anatomic alignment on this single view. There is a nondisplaced fracture across the base of the lesser trochanter. There is no evidence of a new fracture or operative complication. IMPRESSION: Well aligned fracture  following ORIF. Electronically Signed   By: Lajean Manes M.D.   On: 09/14/2015 17:49   Dg Knee Left Port  09/14/2015  CLINICAL DATA:  Fall, left knee pain EXAM: PORTABLE LEFT KNEE - 1-2 VIEW COMPARISON:  12/28/2013 FINDINGS: Two views of the left knee submitted. Mild narrowing of medial joint compartment. There is narrowing of patellofemoral joint space.  No acute fracture or subluxation. Spurring of patella. IMPRESSION: No acute fracture or subluxation. Degenerative changes as described above. Electronically Signed   By: Lahoma Crocker M.D.   On: 09/14/2015 09:53   Dg C-arm 1-60 Min  09/14/2015  CLINICAL DATA:  ORIF left hip fracture. EXAM: DG C-ARM 61-120 MIN; LEFT FEMUR 2 VIEWS COMPARISON:  Left hip radiographs 09/14/2015. FINDINGS: ORIF is noted. An intra medullary rod is in place. A single distal interlocking screw is noted. A dynamic hip screw is present across the intertrochanteric fracture. Fracture is reduced. IMPRESSION: ORIF of the left femur without radiographic evidence for complication. Electronically Signed   By: San Morelle M.D.   On: 09/14/2015 16:20   Dg Hip Unilat With Pelvis 2-3 Views Left  09/14/2015  CLINICAL DATA:  Fall with left hip fracture. Initial encounter. EXAM: DG HIP (WITH OR WITHOUT PELVIS) 2-3V LEFT COMPARISON:  None. FINDINGS: Acute intertrochanteric left femur fracture with apex ventral angulation and medial impaction. Both hips are located. No evidence of pelvic ring fracture or diastasis. Osteopenia and atherosclerosis. IMPRESSION: Displaced intertrochanteric left femur fracture. Electronically Signed   By: Monte Fantasia M.D.   On: 09/14/2015 08:08   Dg Femur Min 2 Views Left  09/14/2015  CLINICAL DATA:  Status post left-sided IM femoral nail. EXAM: LEFT FEMUR 2 VIEWS COMPARISON:  Fluoroscopic images from earlier same day. FINDINGS: Postoperative film of the left femur shows appropriate positioning of the intra medullary rod. The dynamic hip screw traverses the fracture site and also appears well positioned. A single distal interlocking screw appears intact and well positioned. No surgical complicating features seen. IMPRESSION: Internal fixation of the left femur fracture without evidence of complicating feature. Electronically Signed   By: Franki Cabot M.D.   On: 09/14/2015 17:51   Dg Femur Min 2 Views  Left  09/14/2015  CLINICAL DATA:  ORIF left hip fracture. EXAM: DG C-ARM 61-120 MIN; LEFT FEMUR 2 VIEWS COMPARISON:  Left hip radiographs 09/14/2015. FINDINGS: ORIF is noted. An intra medullary rod is in place. A single distal interlocking screw is noted. A dynamic hip screw is present across the intertrochanteric fracture. Fracture is reduced. IMPRESSION: ORIF of the left femur without radiographic evidence for complication. Electronically Signed   By: San Morelle M.D.   On: 09/14/2015 16:20     PERTINENT LAB RESULTS: CBC:  Recent Labs  09/16/15 0337 09/17/15 0555  WBC 10.5 8.9  HGB 9.7* 9.6*  HCT 31.2* 29.4*  PLT 156 168   CMET CMP     Component Value Date/Time   NA 138 09/16/2015 0337   NA 143 07/15/2015 1234   K 3.6 09/16/2015 0337   K 4.6 07/15/2015 1234   CL 109 09/16/2015 0337   CL 106 04/24/2013 0930   CO2 26 09/16/2015 0337   CO2 30* 07/15/2015 1234   GLUCOSE 116* 09/16/2015 0337   GLUCOSE 92 07/15/2015 1234   GLUCOSE 104* 04/24/2013 0930   BUN 13 09/16/2015 0337   BUN 13.3 07/15/2015 1234   CREATININE 0.66 09/16/2015 0337   CREATININE 0.8 07/15/2015 1234   CALCIUM 7.3* 09/16/2015 3235  CALCIUM 9.0 07/15/2015 1234   PROT 6.4* 09/14/2015 0732   PROT 6.2* 07/15/2015 1234   ALBUMIN 3.3* 09/14/2015 0732   ALBUMIN 3.3* 07/15/2015 1234   AST 36 09/14/2015 0732   AST 19 07/15/2015 1234   ALT 16 09/14/2015 0732   ALT 15 07/15/2015 1234   ALKPHOS 69 09/14/2015 0732   ALKPHOS 57 07/15/2015 1234   BILITOT 1.3* 09/14/2015 0732   BILITOT 0.84 07/15/2015 1234   GFRNONAA >60 09/16/2015 0337   GFRAA >60 09/16/2015 0337    GFR Estimated Creatinine Clearance: 37.6 mL/min (by C-G formula based on Cr of 0.66). No results for input(s): LIPASE, AMYLASE in the last 72 hours.  Recent Labs  09/14/15 1842 09/15/15 0626 09/16/15 [place] input(s): POCBNP No results for input(s): DDIMER in the last 72 hours.  Recent Labs   09/15/15 0626  HGBA1C 6.3*    Recent Labs  09/15/15 0626  CHOL 120  HDL 36*  LDLCALC 76  TRIG 41  CHOLHDL 3.3   No results for input(s): TSH, T4TOTAL, T3FREE, THYROIDAB in the last 72 hours.  Invalid input(s): FREET3 No results for input(s): VITAMINB12, FOLATE, FERRITIN, TIBC, IRON, RETICCTPCT in the last 72 hours. Coags: No results for input(s): INR in the last 72 hours.  Invalid input(s): PT Microbiology: Recent Results (from the past 240 hour(s))  Urine culture     Status: None   Collection Time: 09/14/15 10:24 AM  Result Value Ref Range Status   Specimen Description URINE, CATHETERIZED  Final   Special Requests NONE  Final   Culture NO GROWTH 1 DAY  Final   Report Status 09/15/2015 FINAL  Final     BRIEF HOSPITAL COURSE:   Displaced intertrochanteric fracture of left femur:Repaired with surgical fixation by orthopedics on 10/125/16. Suspect secondary to a mechanical fall from? Seizure. Doing well-on no pharmacologic DVT prophylaxis secondary to a small intraventricular hemorrhage. Orthopedics following-recommendations are for weightbearing as tolerated with a walker. Please ensure follow-up with orthopedics on discharge-continue TED hose on discharge. Being discharged to a skilled nursing facility in a stable manner.   Intraventricular hemorrhage: Suspect traumatic-from? Seizure. CT head demonstrates small lateral intraventricular hemorrhage with stable ventricular . MRI/MRA confirms small intraventricular hemorrhage without any acute changes. No evidence of AVM or aneurysms. 2-D echocardiogram shows preserved ejection fraction without any embolic source. Carotid Doppler shows 40-59% stenosis on the right internal carotid artery-likely asymptomatic. Will need outpatient follow-up with her primary neurologist. Continue with supportive care and avoid antiplatelets/anticoagulation for now-until cleared for such agents by her primary neurologist  Mild rhabdomyolysis: Etiology  likely secondary to fall and being down for a unknown period of time. CK levels down trending, supportive care. Please PT CK in one week  History of dyslipidemia:Lipid panel shows total cholesterol 120, LDL 76. Not on medication prior to admission-we will start statins once CK has down trended further-this will be deferred to the outpatient setting  History of seizure disorder: EEG negative-given prior history of seizures-unexplained falls causing hip fracture and intraventricular hemorrhage-after speaking with neurology-started on Keppra. Please ensure follow-up with neurology in the outpatient setting  Anemia: Suspected perioperative blood loss anemia. Hemoglobin stable at 9.6. Please repeat CBC in 1 week.  Hypertension:controlled-continue metoprolol. Follow  Prolonged QTC: Secondary to left bundle branch proximal-supportive care  Dementia with behavioral disturbance: Continue Aricept. Pleasantly confused. Follows with [facility] neurology as outpatient.  Breast cancer of the upper-outer quadrant of right female breast:Continue Arimidex  Mild dysphagia:  Seen by speech therapy-recommendations are for a dysphagia 3 diet. This M.D. spoke with patient and then son over the phone-agreeable with this plan.   TODAY-DAY OF DISCHARGE:  Subjective:   [name] today remains pleasantly confused-but is able to follow some commands. She denies any major pain.  Objective:   Blood pressure 126/60, pulse 110, temperature 97.8 F (36.6 C), temperature source Oral, resp. rate 20, height 5\' 3"  (1.6 m), weight 48.988 kg (108 lb), SpO2 92 %.  Intake/Output Summary (Last 24 hours) at 09/17/15 1000 Last data filed at 09/16/15 1018  Gross per 24 hour  Intake    480 ml  Output      0 ml  Net    480 ml   Filed Weights   09/15/15 1409  Weight: 48.988 kg (108 lb)    Exam Awake Alert, No new F.N deficits, Normal affect [place].AT,PERRAL Supple Neck,No JVD, No cervical lymphadenopathy appriciated.   Symmetrical Chest wall movement, Good air movement bilaterally, CTAB RRR,No Gallops,Rubs or new Murmurs, No Parasternal Heave +ve B.Sounds, Abd Soft, Non tender, No organomegaly appriciated, No rebound -guarding or rigidity. No Cyanosis, Clubbing or edema, No new Rash or bruise  DISCHARGE CONDITION: Stable  DISPOSITION: SNF  DISCHARGE INSTRUCTIONS:    Activity:  As tolerated with Full fall precautions use walker/cane & assistance as needed  Get Medicines reviewed and adjusted: Please take all your medications with you for your next visit with your Primary MD  Please request your Primary MD to go over all hospital tests and procedure/radiological results at the follow up, please ask your Primary MD to get all Hospital records sent to his/her office.  If you experience worsening of your admission symptoms, develop shortness of breath, life threatening emergency, suicidal or homicidal thoughts you must seek medical attention immediately by calling 911 or calling your MD immediately  if symptoms less severe.  You must read complete instructions/literature along with all the possible adverse reactions/side effects for all the Medicines you take and that have been prescribed to you. Take any new Medicines after you have completely understood and accpet all the possible adverse reactions/side effects.   Do not drive when taking Pain medications.   Do not take more than prescribed Pain, Sleep and Anxiety Medications  Special Instructions: If you have smoked or chewed Tobacco  in the last 2 yrs please stop smoking, stop any regular Alcohol  and or any Recreational drug use.  Wear Seat belts while driving.  Please note  You were cared for by a hospitalist during your hospital stay. Once you are discharged, your primary care physician will handle any further medical issues. Please note that NO REFILLS for any discharge medications will be authorized once you are discharged, as it is  imperative that you return to your primary care physician (or establish a relationship with a primary care physician if you do not have one) for your aftercare needs so that they can reassess your need for medications and monitor your lab values.   Diet recommendation: Diet recommendations: Dysphagia 3 (mechanical soft);Thin liquid Liquids provided via: Straw;Cup Medication Administration: Whole meds with puree Supervision: Patient able to self feed;Full supervision/cueing for compensatory strategies Compensations: Slow rate;Small sips/bites Postural Changes and/or Swallow Maneuvers: Seated upright 90 degrees  Discharge Instructions    Call MD for:  extreme fatigue    Complete by:  As directed      Call MD for:  persistant nausea and vomiting    Complete by:  As  directed      Call MD for:  redness, tenderness, or signs of infection (pain, swelling, redness, odor or green/yellow discharge around incision site)    Complete by:  As directed      Diet - low sodium heart healthy    Complete by:  As directed   Diet recommendations: Dysphagia 3 (mechanical soft);Thin liquid Liquids provided via: Straw;Cup Medication Administration: Whole meds with puree Supervision: Patient able to self feed;Full supervision/cueing for compensatory strategies Compensations: Slow rate;Small sips/bites Postural Changes and/or Swallow Maneuvers: Seated upright 90 degrees     Driving Restrictions    Complete by:  As directed   No driving, operating heavy machinery, activities at [employer] high speed sports     Increase activity slowly    Complete by:  As directed   Weight bearing as tolerated with a walker           Follow-up Information    Follow up with Swinteck, Horald Pollen, MD. Schedule an appointment as soon as possible for a visit in 2 weeks.   Specialty:  Orthopedic Surgery   Why:  For wound re-check   Contact information:   [address] [place] [address] [phone]        Follow up with [name], [name], MD. Schedule an appointment as soon as possible for a visit in 2 weeks.   Specialty:  Family Medicine   Contact information:   [address] [place] [address] [phone]       Follow up with Marcial Pacas, MD. Schedule an appointment as soon as possible for a visit in 2 weeks.   Specialty:  Neurology   Why:  Hospital follow up   Contact information:   [address] [place] [address] [phone]      Total Time spent on discharge equals  45 minutes.  SignedOren Binet 09/17/2015 10:00 AM

## 2015-09-17 NOTE — Clinical Social Work Placement (Addendum)
   CLINICAL SOCIAL WORK PLACEMENT  NOTE 09/17/15 - DISCHARGED TO [facility] Date:  09/17/2015  Patient Details  Name: [name] MRN: [id number] Date of Birth: 05-14-1925  Clinical Social Work is seeking post-discharge placement for this patient at the [place] level of care (*CSW will initial, date and re-position this form in  chart as items are completed):  Yes   Patient/family provided with [facility] Work Department's list of facilities offering this level of care within the geographic area requested by the patient (or if unable, by the patient's family).  Yes   Patient/family informed of their freedom to choose among providers that offer the needed level of care, that participate in Medicare, Medicaid or managed care program needed by the patient, have an available bed and are willing to accept the patient.  Yes   Patient/family informed of [facility]'s ownership interest in [facility] and [facility], as well as of the fact that they are under no obligation to receive care at these facilities.  PASRR submitted to EDS on 09/15/15     PASRR number received on 09/15/15     Existing PASRR number confirmed on       FL2 transmitted to all facilities in geographic area requested by pt/family on 09/15/15     FL2 transmitted to all facilities within larger geographic area on       Patient informed that his/her managed care company has contracts with or will negotiate with certain facilities, including the following:         YES - Patient/family informed of bed offers received.  Patient chooses bed at  [facility]     Physician recommends and patient chooses bed at      Patient to be transferred to  [facility] on  09/17/15.  Patient to be transferred to facility by  ambulance     Patient family notified on  09/17/15 of transfer.  Name of family member notified:   Son, Zenaya Ulatowski     PHYSICIAN Please sign FL2      Additional Comment:    _______________________________________________ Sable Feil, LCSW 09/17/2015, 5:38 PM

## 2015-09-17 NOTE — Progress Notes (Signed)
Report called to [facility]. Given to Quillian Quince, Therapist, sports. Left callback number should any questions arise. Waiting for pt transportation at this time. IV removed and pt is clothed. Pt's son took all positions to SNF. Wendee Copp  e

## 2015-09-17 NOTE — Progress Notes (Signed)
Subjective: Patient reports no pain.  Does not know that she is in the hospital.  No side effects noted to Keppra.    Objective: Current vital signs: BP 138/60 mmHg  Pulse 90  Temp(Src) 98.6 F (37 C) (Oral)  Resp 18  Ht 5\' 3"  (1.6 m)  Wt 48.988 kg (108 lb)  BMI 19.14 kg/m2  SpO2 90% Vital signs in last 24 hours: Temp:  [97.6 F (36.4 C)-98.9 F (37.2 C)] 98.6 F (37 C) (10/28 0537) Pulse Rate:  [82-99] 90 (10/28 0537) Resp:  [18-20] 18 (10/28 0537) BP: (115-143)/(45-68) 138/60 mmHg (10/28 0537) SpO2:  [90 %-100 %] 90 % (10/28 0918)  Intake/Output from previous day: 10/27 0701 - 10/28 0700 In: 480 [P.O.:480] Out: -  Intake/Output this shift:   Nutritional status: DIET DYS 3 Room service appropriate?: Yes; Fluid consistency:: Thin  Neurologic Exam: Mental Status: Alert, not oriented, does not recall surgery or the fact she is in the hospital.  Speech fluent without evidence of aphasia. Able to follow simple commands without difficulty. Cranial Nerves: II: Legally blind. Pupils equal, round, reactive to light and accommodation III,IV, VI: ptosis not present, extra-ocular motions intact bilaterally V,VII: smile symmetric, facial light touch sensation normal bilaterally VIII: hearing normal bilaterally IX,X: uvula rises symmetrically XI: bilateral shoulder shrug XII: midline tongue extension Motor: Right :Upper extremity 5/5Left: Upper extremity 5/5  Lab Results: Basic Metabolic Panel:  Recent Labs Lab 09/14/15 0732 09/15/15 0626 09/16/15 0337  NA 139 137 138  K 4.0 3.7 3.6  CL 103 107 109  CO2 25 24 26   GLUCOSE 149* 112* 116*  BUN 17 12 13   CREATININE 0.94 0.79 0.66  CALCIUM 8.5* 7.1* 7.3*  MG  --   --  2.1    Liver Function Tests:  Recent Labs Lab 09/14/15 0732  AST 36  ALT 16  ALKPHOS 69  BILITOT 1.3*  PROT 6.4*  ALBUMIN 3.3*   No results for input(s): LIPASE, AMYLASE in the last 168  hours. No results for input(s): AMMONIA in the last 168 hours.  CBC:  Recent Labs Lab 09/14/15 0732 09/15/15 0626 09/16/15 0337 09/17/15 0555  WBC 14.8* 11.9* 10.5 8.9  NEUTROABS 13.6*  --   --   --   HGB 12.9 10.1* 9.7* 9.6*  HCT 39.5 32.3* 31.2* 29.4*  MCV 91.4 93.9 94.3 93.3  PLT 252 173 156 168    Cardiac Enzymes:  Recent Labs Lab 09/14/15 0732 09/14/15 1842 09/15/15 0626 09/16/15 0337  CKTOTAL 302* 1993* 1970* 861*    Lipid Panel:  Recent Labs Lab 09/15/15 0626  CHOL 120  TRIG 41  HDL 36*  CHOLHDL 3.3  VLDL 8  LDLCALC 76    CBG:  Recent Labs Lab 09/16/15 1200 09/16/15 1618 09/16/15 2218 09/17/15 0633  GLUCAP 164* 120* 171* 107*    Microbiology: Results for orders placed or performed during the hospital encounter of 09/14/15  Urine culture     Status: None   Collection Time: 09/14/15 10:24 AM  Result Value Ref Range Status   Specimen Description URINE, CATHETERIZED  Final   Special Requests NONE  Final   Culture NO GROWTH 1 DAY  Final   Report Status 09/15/2015 FINAL  Final    Coagulation Studies: No results for input(s): LABPROT, INR in the last 72 hours.  Imaging: Mr Virgel Paling Wo Contrast  09/15/2015  CLINICAL DATA:  Recent fall with hip fracture and bilateral intraventricular hemorrhage. EXAM: MRI HEAD WITHOUT CONTRAST MRA HEAD WITHOUT  CONTRAST TECHNIQUE: Multiplanar, multiecho pulse sequences of the brain and surrounding structures were obtained without intravenous contrast. Angiographic images of the head were obtained using MRA technique without contrast. COMPARISON:  CT head without contrast 09/14/2015. FINDINGS: MRI HEAD FINDINGS A small amount of layering blood is noted in the posterior horns of the lateral ventricles bilaterally, not significantly changed in the interval. There is no hydrocephalus. No new areas of hemorrhage are present. Moderate atrophy and diffuse white matter disease is evident bilaterally. Remote lacunar infarcts  are present within the cerebellum bilaterally. Dilated perivascular spaces are noted bilaterally. Flow is present in the major intracranial arteries bilaterally. No significant extra-axial collection is present. The globes and orbits are intact. There is mild mucosal thickening in the maxillary sinuses bilaterally. Scattered opacification of ethmoid air cells is noted. Right greater and left frontal sinus disease is noted. The sphenoid sinuses and mastoid air cells are clear. MRA HEAD FINDINGS Internal carotid arteries are within normal limits from the high cervical segments through the ICA termini bilaterally. The A1 segments are normal bilaterally. The right M1 segment is normal. There is a moderate to high-grade stenosis in the distal left M1 segment. Distal MCA branch vessel attenuation is present bilaterally. There is some attenuation of distal ACA branch vessels as well. The left vertebral artery is the dominant vessel. The PICA origins are visualized and normal. There is mild narrowing of the distal left vertebral artery. The basilar artery is normal. There is moderate proximal PCA stenosis bilaterally. There is mild attenuation of distal PCA branch vessels bilaterally, right greater than left IMPRESSION: 1. Stable appearance of blood products layering posteriorly in the lateral ventricles bilaterally without hydrocephalus. 2. No other acute or chronic hemorrhage. 3. Moderate generalized atrophy and diffuse white matter disease compatible with chronic microvascular ischemia. 4. Moderate distal left M1 segment stenosis. 5. Moderate diffuse distal small vessel disease. 6. Mild distal left vertebral artery stenosis. 7. Moderate proximal PCA stenoses bilaterally. Electronically Signed   By: San Morelle M.D.   On: 09/15/2015 16:15   Mr Brain Wo Contrast  09/15/2015  CLINICAL DATA:  Recent fall with hip fracture and bilateral intraventricular hemorrhage. EXAM: MRI HEAD WITHOUT CONTRAST MRA HEAD WITHOUT  CONTRAST TECHNIQUE: Multiplanar, multiecho pulse sequences of the brain and surrounding structures were obtained without intravenous contrast. Angiographic images of the head were obtained using MRA technique without contrast. COMPARISON:  CT head without contrast 09/14/2015. FINDINGS: MRI HEAD FINDINGS A small amount of layering blood is noted in the posterior horns of the lateral ventricles bilaterally, not significantly changed in the interval. There is no hydrocephalus. No new areas of hemorrhage are present. Moderate atrophy and diffuse white matter disease is evident bilaterally. Remote lacunar infarcts are present within the cerebellum bilaterally. Dilated perivascular spaces are noted bilaterally. Flow is present in the major intracranial arteries bilaterally. No significant extra-axial collection is present. The globes and orbits are intact. There is mild mucosal thickening in the maxillary sinuses bilaterally. Scattered opacification of ethmoid air cells is noted. Right greater and left frontal sinus disease is noted. The sphenoid sinuses and mastoid air cells are clear. MRA HEAD FINDINGS Internal carotid arteries are within normal limits from the high cervical segments through the ICA termini bilaterally. The A1 segments are normal bilaterally. The right M1 segment is normal. There is a moderate to high-grade stenosis in the distal left M1 segment. Distal MCA branch vessel attenuation is present bilaterally. There is some attenuation of distal ACA branch vessels as well.  The left vertebral artery is the dominant vessel. The PICA origins are visualized and normal. There is mild narrowing of the distal left vertebral artery. The basilar artery is normal. There is moderate proximal PCA stenosis bilaterally. There is mild attenuation of distal PCA branch vessels bilaterally, right greater than left IMPRESSION: 1. Stable appearance of blood products layering posteriorly in the lateral ventricles bilaterally  without hydrocephalus. 2. No other acute or chronic hemorrhage. 3. Moderate generalized atrophy and diffuse white matter disease compatible with chronic microvascular ischemia. 4. Moderate distal left M1 segment stenosis. 5. Moderate diffuse distal small vessel disease. 6. Mild distal left vertebral artery stenosis. 7. Moderate proximal PCA stenoses bilaterally. Electronically Signed   By: San Morelle M.D.   On: 09/15/2015 16:15    Medications:  I have reviewed the patient's current medications. Scheduled: .  stroke: mapping our early stages of recovery book   Does not apply Once  . acetaminophen  1,000 mg Oral 3 times per day  . anastrozole  1 mg Oral Daily  . donepezil  10 mg Oral QHS  . feeding supplement (ENSURE ENLIVE)  237 mL Oral BID BM  . insulin aspart  0-5 Units Subcutaneous QHS  . insulin aspart  0-9 Units Subcutaneous TID WC  . ipratropium-albuterol  3 mL Nebulization TID  . metoprolol  50 mg Oral BID  . polyethylene glycol  17 g Oral Daily    Assessment/Plan: Patient tolerating Keppra.  No seizure activity noted.    Recommendations: 1.  Continue Keppra at 500mg  BID   LOS: 3 days   Alexis Goodell, MD [facility] [phone] 09/17/2015  9:38 AM

## 2015-09-17 NOTE — Progress Notes (Signed)
Speech Language Pathology Treatment: Dysphagia  Patient Details Name: [name] MRN: [id number] DOB: 02-12-1925 Today's Date: 09/17/2015 Time: 3790-2409 SLP Time Calculation (min) (ACUTE ONLY): 10 min  Assessment / Plan / Recommendation Clinical Impression  Remains pleasantly confused, states she is "right here" when asked location.  She demonstrates improved safety with eating, better mastication of solids, no s/s of aspiration.  Mod I cueing for self-feeding only. Swallow function appears to be at baseline.  Pt scheduled for D/C today; will not need SLP f/u at SNF.    HPI Other Pertinent Information: 81 year old female patient who lives alone but apparently does have a care provider name [name] who works with the patient regularly. She has a past medical history of dyslipidemia, hypertension and breast cancer on oral medications as well as a history of memory loss and prior visual hallucinations. 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 She was sent to the hospital after being found down outside her home on the concrete sidewalk for an unknown period of time.   Pertinent Vitals    SLP Plan  All goals met    Recommendations Diet recommendations: Regular;Thin liquid Liquids provided via: Straw;Cup Medication Administration: Whole meds with puree Supervision: Patient able to self feed Postural Changes and/or Swallow Maneuvers: Seated upright 90 degrees              Oral Care Recommendations: Oral care BID Follow up Recommendations: None Plan: All goals met    GO     Juan Quam Laurice 09/17/2015, 11:45 AM

## 2015-09-17 NOTE — Progress Notes (Signed)
   Subjective:  Patient reports pain as mild.  No c/o.  Objective:   VITALS:   Filed Vitals:   09/17/15 0241 09/17/15 0537 09/17/15 0918 09/17/15 0947  BP: 120/45 138/60  126/60  Pulse: 90 90  110  Temp: 98.9 F (37.2 C) 98.6 F (37 C)  97.8 F (36.6 C)  TempSrc: Oral Oral  Oral  Resp: 20 18  20   Height:      Weight:      SpO2: 93% 100% 90% 92%    ABD soft Sensation intact distally Intact pulses distally Dorsiflexion/Plantar flexion intact Incision: dressing C/D/I Compartment soft   Lab Results  Component Value Date   WBC 8.9 09/17/2015   HGB 9.6* 09/17/2015   HCT 29.4* 09/17/2015   MCV 93.3 09/17/2015   PLT 168 09/17/2015   BMET    Component Value Date/Time   NA 138 09/16/2015 0337   NA 143 07/15/2015 1234   K 3.6 09/16/2015 0337   K 4.6 07/15/2015 1234   CL 109 09/16/2015 0337   CL 106 04/24/2013 0930   CO2 26 09/16/2015 0337   CO2 30* 07/15/2015 1234   GLUCOSE 116* 09/16/2015 0337   GLUCOSE 92 07/15/2015 1234   GLUCOSE 104* 04/24/2013 0930   BUN 13 09/16/2015 0337   BUN 13.3 07/15/2015 1234   CREATININE 0.66 09/16/2015 0337   CREATININE 0.8 07/15/2015 1234   CALCIUM 7.3* 09/16/2015 0337   CALCIUM 9.0 07/15/2015 1234   GFRNONAA >60 09/16/2015 0337   GFRAA >60 09/16/2015 8182     Assessment/Plan: 3 Days Post-Op   Principal Problem:   Displaced intertrochanteric fracture of left femur (HCC) Active Problems:   Breast cancer of upper-outer quadrant of right female breast (HCC)   Memory loss   Visual hallucination (history of)   Osteopenia   Intraventricular hemorrhage (HCC)   HTN (hypertension)   Dyslipidemia   Rhabdomyolysis   Elevated lactic acid level   Closed pertrochanteric fracture (HCC)   Delirium   Dementia with behavioral disturbance   Essential hypertension   IVH (intraventricular hemorrhage) ([place])   WBAT with walker DVT ppx: SCDs, TEDs, not a candidate for chemical pppx IVH per neurology PO pain control PT/OT D/C  planning   Tamela Elsayed, Horald Pollen 09/17/2015, 10:26 AM   Rod Can, MD Cell [phone]

## 2015-09-17 NOTE — Care Management Important Message (Signed)
Important Message  Patient Details  Name: [name] MRN: [id number] Date of Birth: 12/16/1924   Medicare Important Message Given:  Yes-second notification given    Delorse Lek 09/17/2015, 1:19 PM

## 2015-09-18 LAB — VITAMIN D 1,25 DIHYDROXY
VITAMIN D 1, 25 (OH) TOTAL: 31 pg/mL
Vitamin D2 1, 25 (OH)2: <10 pg/mL
Vitamin D3 1, 25 (OH)2: 31 pg/mL

## 2015-09-20 ENCOUNTER — Encounter: Payer: Self-pay | Admitting: Adult Health

## 2015-09-21 LAB — CBC AND DIFFERENTIAL
HCT: 36 % (ref 36–46)
HEMOGLOBIN: 11 g/dL — AB (ref 12.0–16.0)
PLATELETS: 371 10*3/uL (ref 150–399)
WBC: 7.9 10*3/mL

## 2015-09-23 ENCOUNTER — Encounter: Payer: Self-pay | Admitting: Adult Health

## 2015-09-24 ENCOUNTER — Encounter: Payer: Self-pay | Admitting: Internal Medicine

## 2015-09-24 ENCOUNTER — Non-Acute Institutional Stay (SKILLED_NURSING_FACILITY): Payer: Medicare Other | Admitting: Internal Medicine

## 2015-09-24 DIAGNOSIS — D62 Acute posthemorrhagic anemia: Secondary | ICD-10-CM | POA: Diagnosis not present

## 2015-09-24 DIAGNOSIS — T796XXS Traumatic ischemia of muscle, sequela: Secondary | ICD-10-CM

## 2015-09-24 DIAGNOSIS — Z853 Personal history of malignant neoplasm of breast: Secondary | ICD-10-CM | POA: Diagnosis not present

## 2015-09-24 DIAGNOSIS — R2681 Unsteadiness on feet: Secondary | ICD-10-CM | POA: Diagnosis not present

## 2015-09-24 DIAGNOSIS — I10 Essential (primary) hypertension: Secondary | ICD-10-CM

## 2015-09-24 DIAGNOSIS — E46 Unspecified protein-calorie malnutrition: Secondary | ICD-10-CM

## 2015-09-24 DIAGNOSIS — S72142S Displaced intertrochanteric fracture of left femur, sequela: Secondary | ICD-10-CM

## 2015-09-24 DIAGNOSIS — I615 Nontraumatic intracerebral hemorrhage, intraventricular: Secondary | ICD-10-CM | POA: Diagnosis not present

## 2015-09-24 DIAGNOSIS — F0390 Unspecified dementia without behavioral disturbance: Secondary | ICD-10-CM | POA: Diagnosis not present

## 2015-09-24 DIAGNOSIS — K5900 Constipation, unspecified: Secondary | ICD-10-CM

## 2015-09-24 DIAGNOSIS — G40909 Epilepsy, unspecified, not intractable, without status epilepticus: Secondary | ICD-10-CM

## 2015-09-24 NOTE — Progress Notes (Signed)
Patient ID: [name], female   DOB: 06-26-1925, 81 y.o.   MRN: [id number]     [facility]  PCP: Antony Blackbird, MD  Code Status: Full Code   Allergies  Allergen Reactions  . Ephedra [Ephedrine] Other (See Comments)    unknown  . Captopril Rash  . Phenobarbital Rash    Chief Complaint  Patient presents with  . New Admit To SNF    New Admission      HPI:  81 y.o. patient is here for short term rehabilitation post hospital admission from 09/14/15-09/17/15 with displaced intertrochanteric fracture of left femur post fall. She underwent ORIF. She had mild rhabdomylosis. She is seen in her room today. She is sitting on her wheelchair. Her pain is under control with current pain regimen. She denies any concerns this visit. She is not on any dvt prophylaxis because of small intraventricular hemorrhage. She has PMH of dementia, HTN, seizure disorder among others.  Review of Systems:  Constitutional: Negative for fever, chills, diaphoresis.  HENT: Negative for headache, congestion, nasal discharge Respiratory: Negative for cough, shortness of breath and wheezing.   Cardiovascular: Negative for chest pain, palpitations, leg swelling.  Gastrointestinal: Negative for heartburn, nausea, vomiting, abdominal pain. Genitourinary: Negative for dysuria. Musculoskeletal: Negative for back pain, falls in the facility Neurological: Negative for dizziness, tingling, focal weakness Psychiatric/Behavioral: Negative for depression   Past Medical History  Diagnosis Date  . Cancer (HCC)     breast  . Psoriasis   . Legally blind     macular degeneration  . Hypertension   . CAD (coronary artery disease) 2007    Last cath 2007, Last echo-09/01/10 EF >15%, RV Systolic [id number] mmHg;Last Nuc 2006 no ischemia  . Pulmonary HTN (HCC)     mild  . Seizure disorder ([place])        . H/O syncope     remotely, no etiology found, mild carotid disease on doppler, 0-49% bil  . Full  dentures   . Wears glasses   . Lives in independent group home     [facility]-  . Memory deficit   . Seasonal allergies   . Hallucinations   . Cataracts, bilateral   . Macular degeneration    Past Surgical History  Procedure Laterality Date  . Cholecystectomy    . Spine surgery    . Appendectomy    . Tonsillectomy    . Cardiac catheterization  11/22/2005    RCA with 20-30% eccentric narrowing other vessels free of disease  . Carpal tunnel release Right 07/29/2013    Procedure: CARPAL TUNNEL RELEASE;  Surgeon: [name], MD;  Location: [facility];  Service: Orthopedics;  Laterality: Right;  . Gallbladder surgery    . Intramedullary (im) nail intertrochanteric Left 09/14/2015    Procedure: INTRAMEDULLARY (IM) NAIL INTERTROCHANTRIC;  Surgeon: Rod Can, MD;  Location: [facility];  Service: Orthopedics;  Laterality: Left;   Social History:   reports that she quit smoking about 20 years ago. Her smoking use included Cigarettes. She quit smokeless tobacco use about 20 years ago. She reports that she does not drink alcohol or use illicit drugs.  Family History  Problem Relation Age of Onset  . Pulmonary embolism Mother 24    caused her death  . CAD Father 23    caused his death  . Stroke Father   . Leukemia Brother 60    cause of death  . CAD Brother   .  CAD Brother     Medications:   Medication List       This list is accurate as of: 09/24/15 11:32 AM.  Always use your most recent med list.               acetaminophen 500 MG tablet  Commonly known as:  TYLENOL  Take 2 tablets (1,000 mg total) by mouth every 8 (eight) hours. For one more week and then change to prn     albuterol (2.5 MG/3ML) 0.083% nebulizer solution  Commonly known as:  PROVENTIL  Take 3 mLs (2.5 mg total) by nebulization every 2 (two) hours as needed for shortness of breath.     ALLEGRA ALLERGY 60 MG tablet  Generic drug:  fexofenadine  Take 1 tablet (60 mg  total) by mouth 2 (two) times daily.     anastrozole 1 MG tablet  Commonly known as:  ARIMIDEX  Take 1 tablet (1 mg total) by mouth daily.     donepezil 10 MG tablet  Commonly known as:  ARICEPT  Take 1 tablet (10 mg total) by mouth at bedtime.     ipratropium-albuterol 0.5-2.5 (3) MG/3ML Soln  Commonly known as:  DUONEB  Take 3 mLs by nebulization 3 (three) times daily.     levETIRAcetam 500 MG tablet  Commonly known as:  KEPPRA  Take 1 tablet (500 mg total) by mouth 2 (two) times daily.     metoprolol 50 MG tablet  Commonly known as:  LOPRESSOR  Take 50 mg by mouth 2 (two) times daily.     oxyCODONE 5 MG immediate release tablet  Commonly known as:  Oxy IR/ROXICODONE  Take 1 tablet (5 mg total) by mouth every 6 (six) hours as needed for moderate pain.     polyethylene glycol packet  Commonly known as:  MIRALAX / GLYCOLAX  Take 17 g by mouth daily.     PROCEL Powd  Take 1 scoop by mouth 2 (two) times daily.     UNABLE TO FIND  Med Name: Med pass twice daily         Physical Exam: Filed Vitals:   09/24/15 1123  BP: 128/56  Pulse: 72  Temp: 97.2 F (36.2 C)  TempSrc: Oral  Resp: 18  Height: 5\' 3"  (1.6 m)  Weight: 113 lb 9.6 oz (51.529 kg)  SpO2: 94%    General- elderly female, thin built, in no acute distress Head- normocephalic, atraumatic Nose- nno maxillary or frontal sinus tenderness, no nasal discharge Throat- moist mucus membrane  Eyes- no pallor, no icterus Neck- no cervical lymphadenopathy Cardiovascular- normal s1,s2, trace left leg edema Respiratory- bilateral clear to auscultation, no wheeze, no rhonchi, no crackles, no use of accessory muscles Abdomen- bowel sounds present, soft, non tender Musculoskeletal- able to move all 4 extremities, limited left leg range of motion  Neurological- no focal deficit, alert and oriented to person and place but not to time Skin- warm and dry, left hip surgical incision with dressing in place Psychiatry-  normal mood and affect    Labs reviewed: Basic Metabolic Panel:  Recent Labs  09/14/15 0732 09/15/15 0626 09/16/15 0337  NA 139 137 138  K 4.0 3.7 3.6  CL 103 107 109  CO2 25 24 26   GLUCOSE 149* 112* 116*  BUN 17 12 13   CREATININE 0.94 0.79 0.66  CALCIUM 8.5* 7.1* 7.3*  MG  --   --  2.1   Liver Function Tests:  Recent Labs  12/15/14 1002  07/15/15 1234 09/14/15 0732  AST 20 19 36  ALT 15 15 16   ALKPHOS 53 57 69  BILITOT 0.85 0.84 1.3*  PROT 7.0 6.2* 6.4*  ALBUMIN 3.8 3.3* 3.3*   No results for input(s): LIPASE, AMYLASE in the last 8760 hours. No results for input(s): AMMONIA in the last 8760 hours. CBC:  Recent Labs  12/15/14 1001 07/15/15 1234  09/14/15 0732 09/15/15 0626 09/16/15 0337 09/17/15 0555 09/21/15  WBC 6.4 5.5  < > 14.8* 11.9* 10.5 8.9 7.9  NEUTROABS 4.5 3.4  --  13.6*  --   --   --   --   HGB 14.6 13.6  < > 12.9 10.1* 9.7* 9.6* 11.0*  HCT 45.8 41.8  < > 39.5 32.3* 31.2* 29.4* 36  MCV 94.4 93.5  < > 91.4 93.9 94.3 93.3  --   PLT 243 240  < > 252 173 156 168 371  < > = values in this interval not displayed. Cardiac Enzymes:  Recent Labs  09/14/15 1842 09/15/15 0626 09/16/15 [place] Exams: Dg Chest 1 View  09/14/2015  CLINICAL DATA:  Injury post fall, left hip fracture EXAM: CHEST 1 VIEW COMPARISON:  03/10/2010 FINDINGS: Cardiomediastinal silhouette is stable. Hyperinflation and chronic interstitial prominence again noted. No acute infiltrate or pulmonary edema. No segmental infiltrate. No pneumothorax. IMPRESSION: No active disease. Again noted hyperinflation and chronic interstitial prominence. Electronically Signed   By: Lahoma Crocker M.D.   On: 09/14/2015 08:03   Ct Head Wo Contrast  09/14/2015  CLINICAL DATA:  Unwitnessed fall EXAM: CT HEAD WITHOUT CONTRAST TECHNIQUE: Contiguous axial images were obtained from the base of the skull through the vertex without intravenous contrast. COMPARISON:   12/28/2013 FINDINGS: Skull and Sinuses:Negative for fracture or destructive process. The mastoids, middle ears, and imaged paranasal sinuses are clear. Orbits: No acute abnormality. Brain: Small volume acute intraventricular hemorrhage layering within the lateral ventricles bilaterally. Stable ventriculomegaly related to atrophy. No contusion or swelling noted. Age congruent small-vessel ischemic changes in the deep cerebral white matter. No evidence of acute infarct, mass lesion, or shift. Linear high-density structure in the left parietal lobe consistent with developmental venous anomaly. Critical Value/emergent results were called by telephone at the time of interpretation on 09/14/2015 at 8:33 am to Dr. Blanchie Dessert , who verbally acknowledged these results. IMPRESSION: 1. Small lateral intraventricular hemorrhage with stable ventricular volume. 2. Stable chronic changes as described above. Electronically Signed   By: Monte Fantasia M.D.   On: 09/14/2015 08:36   Mr Jodene Nam Head Wo Contrast  09/15/2015  CLINICAL DATA:  Recent fall with hip fracture and bilateral intraventricular hemorrhage. EXAM: MRI HEAD WITHOUT CONTRAST MRA HEAD WITHOUT CONTRAST TECHNIQUE: Multiplanar, multiecho pulse sequences of the brain and surrounding structures were obtained without intravenous contrast. Angiographic images of the head were obtained using MRA technique without contrast. COMPARISON:  CT head without contrast 09/14/2015. FINDINGS: MRI HEAD FINDINGS A small amount of layering blood is noted in the posterior horns of the lateral ventricles bilaterally, not significantly changed in the interval. There is no hydrocephalus. No new areas of hemorrhage are present. Moderate atrophy and diffuse white matter disease is evident bilaterally. Remote lacunar infarcts are present within the cerebellum bilaterally. Dilated perivascular spaces are noted bilaterally. Flow is present in the major intracranial arteries bilaterally. No  significant extra-axial collection is present. The globes and orbits are intact. There is mild mucosal thickening in the maxillary sinuses bilaterally.  Scattered opacification of ethmoid air cells is noted. Right greater and left frontal sinus disease is noted. The sphenoid sinuses and mastoid air cells are clear. MRA HEAD FINDINGS Internal carotid arteries are within normal limits from the high cervical segments through the ICA termini bilaterally. The A1 segments are normal bilaterally. The right M1 segment is normal. There is a moderate to high-grade stenosis in the distal left M1 segment. Distal MCA branch vessel attenuation is present bilaterally. There is some attenuation of distal ACA branch vessels as well. The left vertebral artery is the dominant vessel. The PICA origins are visualized and normal. There is mild narrowing of the distal left vertebral artery. The basilar artery is normal. There is moderate proximal PCA stenosis bilaterally. There is mild attenuation of distal PCA branch vessels bilaterally, right greater than left IMPRESSION: 1. Stable appearance of blood products layering posteriorly in the lateral ventricles bilaterally without hydrocephalus. 2. No other acute or chronic hemorrhage. 3. Moderate generalized atrophy and diffuse white matter disease compatible with chronic microvascular ischemia. 4. Moderate distal left M1 segment stenosis. 5. Moderate diffuse distal small vessel disease. 6. Mild distal left vertebral artery stenosis. 7. Moderate proximal PCA stenoses bilaterally. Electronically Signed   By: San Morelle M.D.   On: 09/15/2015 16:15   Mr Brain Wo Contrast  09/15/2015  CLINICAL DATA:  Recent fall with hip fracture and bilateral intraventricular hemorrhage. EXAM: MRI HEAD WITHOUT CONTRAST MRA HEAD WITHOUT CONTRAST TECHNIQUE: Multiplanar, multiecho pulse sequences of the brain and surrounding structures were obtained without intravenous contrast. Angiographic images  of the head were obtained using MRA technique without contrast. COMPARISON:  CT head without contrast 09/14/2015. FINDINGS: MRI HEAD FINDINGS A small amount of layering blood is noted in the posterior horns of the lateral ventricles bilaterally, not significantly changed in the interval. There is no hydrocephalus. No new areas of hemorrhage are present. Moderate atrophy and diffuse white matter disease is evident bilaterally. Remote lacunar infarcts are present within the cerebellum bilaterally. Dilated perivascular spaces are noted bilaterally. Flow is present in the major intracranial arteries bilaterally. No significant extra-axial collection is present. The globes and orbits are intact. There is mild mucosal thickening in the maxillary sinuses bilaterally. Scattered opacification of ethmoid air cells is noted. Right greater and left frontal sinus disease is noted. The sphenoid sinuses and mastoid air cells are clear. MRA HEAD FINDINGS Internal carotid arteries are within normal limits from the high cervical segments through the ICA termini bilaterally. The A1 segments are normal bilaterally. The right M1 segment is normal. There is a moderate to high-grade stenosis in the distal left M1 segment. Distal MCA branch vessel attenuation is present bilaterally. There is some attenuation of distal ACA branch vessels as well. The left vertebral artery is the dominant vessel. The PICA origins are visualized and normal. There is mild narrowing of the distal left vertebral artery. The basilar artery is normal. There is moderate proximal PCA stenosis bilaterally. There is mild attenuation of distal PCA branch vessels bilaterally, right greater than left IMPRESSION: 1. Stable appearance of blood products layering posteriorly in the lateral ventricles bilaterally without hydrocephalus. 2. No other acute or chronic hemorrhage. 3. Moderate generalized atrophy and diffuse white matter disease compatible with chronic  microvascular ischemia. 4. Moderate distal left M1 segment stenosis. 5. Moderate diffuse distal small vessel disease. 6. Mild distal left vertebral artery stenosis. 7. Moderate proximal PCA stenoses bilaterally. Electronically Signed   By: San Morelle M.D.   On: 09/15/2015 16:15  Pelvis Portable  09/14/2015  CLINICAL DATA:  Status post left femur fracture ORIF EXAM: PORTABLE PELVIS 1-2 VIEWS COMPARISON:  Operative images from this same date FINDINGS: Intra medullary rod supports a compression screw which extends from the lateral proximal femur across the neck into the femoral head. Primary fracture components are in anatomic alignment on this single view. There is a nondisplaced fracture across the base of the lesser trochanter. There is no evidence of a new fracture or operative complication. IMPRESSION: Well aligned fracture following ORIF. Electronically Signed   By: Lajean Manes M.D.   On: 09/14/2015 17:49   Dg Knee Left Port  09/14/2015  CLINICAL DATA:  Fall, left knee pain EXAM: PORTABLE LEFT KNEE - 1-2 VIEW COMPARISON:  12/28/2013 FINDINGS: Two views of the left knee submitted. Mild narrowing of medial joint compartment. There is narrowing of patellofemoral joint space. No acute fracture or subluxation. Spurring of patella. IMPRESSION: No acute fracture or subluxation. Degenerative changes as described above. Electronically Signed   By: Lahoma Crocker M.D.   On: 09/14/2015 09:53   Dg C-arm 1-60 Min  09/14/2015  CLINICAL DATA:  ORIF left hip fracture. EXAM: DG C-ARM 61-120 MIN; LEFT FEMUR 2 VIEWS COMPARISON:  Left hip radiographs 09/14/2015. FINDINGS: ORIF is noted. An intra medullary rod is in place. A single distal interlocking screw is noted. A dynamic hip screw is present across the intertrochanteric fracture. Fracture is reduced. IMPRESSION: ORIF of the left femur without radiographic evidence for complication. Electronically Signed   By: San Morelle M.D.   On: 09/14/2015 16:20    Dg Hip Unilat With Pelvis 2-3 Views Left  09/14/2015  CLINICAL DATA:  Fall with left hip fracture. Initial encounter. EXAM: DG HIP (WITH OR WITHOUT PELVIS) 2-3V LEFT COMPARISON:  None. FINDINGS: Acute intertrochanteric left femur fracture with apex ventral angulation and medial impaction. Both hips are located. No evidence of pelvic ring fracture or diastasis. Osteopenia and atherosclerosis. IMPRESSION: Displaced intertrochanteric left femur fracture. Electronically Signed   By: Monte Fantasia M.D.   On: 09/14/2015 08:08   Dg Femur Min 2 Views Left  09/14/2015  CLINICAL DATA:  Status post left-sided IM femoral nail. EXAM: LEFT FEMUR 2 VIEWS COMPARISON:  Fluoroscopic images from earlier same day. FINDINGS: Postoperative film of the left femur shows appropriate positioning of the intra medullary rod. The dynamic hip screw traverses the fracture site and also appears well positioned. A single distal interlocking screw appears intact and well positioned. No surgical complicating features seen. IMPRESSION: Internal fixation of the left femur fracture without evidence of complicating feature. Electronically Signed   By: Franki Cabot M.D.   On: 09/14/2015 17:51   Dg Femur Min 2 Views Left  09/14/2015  CLINICAL DATA:  ORIF left hip fracture. EXAM: DG C-ARM 61-120 MIN; LEFT FEMUR 2 VIEWS COMPARISON:  Left hip radiographs 09/14/2015. FINDINGS: ORIF is noted. An intra medullary rod is in place. A single distal interlocking screw is noted. A dynamic hip screw is present across the intertrochanteric fracture. Fracture is reduced. IMPRESSION: ORIF of the left femur without radiographic evidence for complication. Electronically Signed   By: San Morelle M.D.   On: 09/14/2015 16:20     Assessment/Plan  Unsteady gait Post left femur fracture. Will have patient work with PT/OT as tolerated to regain strength and restore function.  Fall precautions are in place.  Left femur fracture S/p ORIF. Will  have her work with physical therapy and occupational therapy team to help with gait  training and muscle strengthening exercises.fall precautions. Skin care. Encourage to be out of bed. WBAT. Continue oxyIR 5 mg q6h prn pain  Blood loss anemia Monitor h&h, post op had some blood loss. Improved hemoglobin  Intraventricular hemorrhage Avoid antiplatelet and anticoagulation. Monitor for acute change in mental status. Fall precautions  rhabdomylosis Monitor CK, denies muscle pain  Protein calorie malnutrition Monitor weight, encourage po intake. Continue procel supplement  Dementia To provide assistance with ADLs. Continue aripcet 10 mg daily  Breast cancer Continue anastrozole 1 mg daily  Seizure disorder Continue keppra 500 mg bid  HTN Continue lopressor 50 mg bid  Constipation Continue miralax 17 g daily   Goals of care: short term rehabilitation   Labs/tests ordered: none  Family/ staff Communication: reviewed care plan with patient and nursing supervisor    Blanchie Serve, MD  [facility] [phone] (Monday-Friday 8 am - 5 pm) [phone] (afterhours)

## 2015-10-04 ENCOUNTER — Ambulatory Visit (INDEPENDENT_AMBULATORY_CARE_PROVIDER_SITE_OTHER): Payer: Medicare Other | Admitting: Neurology

## 2015-10-04 ENCOUNTER — Encounter: Payer: Self-pay | Admitting: Neurology

## 2015-10-04 VITALS — BP 76/50 | HR 120

## 2015-10-04 DIAGNOSIS — R269 Unspecified abnormalities of gait and mobility: Secondary | ICD-10-CM | POA: Diagnosis not present

## 2015-10-04 DIAGNOSIS — R55 Syncope and collapse: Secondary | ICD-10-CM | POA: Insufficient documentation

## 2015-10-04 DIAGNOSIS — F03918 Unspecified dementia, unspecified severity, with other behavioral disturbance: Secondary | ICD-10-CM

## 2015-10-04 DIAGNOSIS — F0391 Unspecified dementia with behavioral disturbance: Secondary | ICD-10-CM | POA: Diagnosis not present

## 2015-10-04 MED ORDER — LEVETIRACETAM 250 MG PO TABS: 250.0000 mg | ORAL_TABLET | Freq: Two times a day (BID) | ORAL | Status: AC

## 2015-10-04 NOTE — Progress Notes (Signed)
PATIENT: [name] DOB: May 10, 1925  Chief Complaint  Patient presents with  . Seizures    She is here with her caregivers, [name] and Anderson Malta.  Reports history of having several seizures.  Most recently, she had two at the end of October.  She is now taking Keppra 500mg , twice daily.  . Cerebrovascular Accident    Abnormal scans in the hospital.  . Memory Loss    MMSE 11/27 - 2 animals.  [name] feels like her memory has continued to worsen.  They give the example of placing food in front of her but she is does not remember how to feed herself.       HISTORICAL  [name] is a 81 years old right-handed female, accompanied by her caregiver [name] and assitant from [place] [name], accompanied by her caregiver [name], returns for evaluation of memory loss, hallucinations. She had past medical history of hypertension, hyperlipidemia, breast cancer, taking Anastrozole.  She was a retired Equities trader, moved to [place] a few years ago, lives alone in her apartment move to [facility] since Oct 2016, she has very poor vision, due to macular degeneration,  She enjoys listening to TV, attend some church activities, her friend check on her regularly  Over the past few months, she has been complaining of a person intruding in her apartment, make himself at home, take away her monies, which is very aggravating for her  During the interview,she could not remember the year she retired, the year she moved to [place]  She has good appetite, sleep well, she has no gait difficulty  UPDATE:12/31/14. [name], 81 year old female returns for followup. She was last seen in this office 06/30/2014.  She has a history of memory loss. She is on Aricept at 10 mg. She was having more confusion and hallucinations when last seen. Seroquel was added but caused too much drowsiness. She returns today with her caregiver [name]. She continues to live in her private apartment, she has good  appetite and she is sleeping well at night. She has not fallen in the last year. She has poor vision due to macular degeneration. She denies any GI side effects to Aricept. She has not had recent hallucinations. She returns for reevaluation.  UPDATE 07/05/2015 [name], 81 year old female returns for follow-up. She has a history of memory loss and is currently on Aricept 10 mg daily without side effects. She was having some hallucinations. Seroquel was started however it caused too much drowsiness. She continues to live alone in her apartment however has a caregiver check on her at least daily to make sure she takes her medications. C/G does cooking and housekeeping for her. The caregiver tells me today that [name] will be moving to [facility] in September to be closer to her only child. Apparently he is trying to get her into a facility. She continues to have a good appetite and sleeps well at night. She has not had any falls. She has poor vision. She has recently had some wandering behavior. She returns for reevaluation   UPDATE Oct 04 2015: She fell in Oct 2016 with left hip fracture, Require hospitalization, and surgical fixation, she is now discharged to [facility] for rehabilitation. She already has signficant memory loss even prior to her hip fracture, now she is more confused, she could not feed herself, has increased about bilateral hands tremor, difficulty using utensils, she sleeps well, but has decreased appetite, only take a few bites of  food, she continue has visual hallucinations  Today she was noted to have low blood pressure, 80/50s, heart rate was 120 regular, during the interview, after taking a few steps,she suddenly slumped over had transient loss of consciousness after sitting back to her wheelchair.  The blood pressure continued to be at 100 over 60 range, with heart rate of 120, no seizure-like activity,  I have reviewed and summarized to hospital admission, she had seizure-like  activity was put on Keppra 500 mg twice a day since September 17 2015, EEG showed generalized background slowing, there was no epileptiform discharge,  I personally reviewed MRI of the brain, in October 2016:Remote lacunar infarcts are present within the cerebellum bilaterally.Stable appearance of blood products layering posteriorly in the lateral ventricles bilaterally without hydrocephalus. No other acute or chronic hemorrhage. Moderate generalized atrophy and diffuse white matter disease, compatible with chronic microvascular ischemia  MRA HEAD: moderate distal left M1 segment stenosis, moderate diffuse distal small vessel disease, mild distal left vertebral artery stenosis, moderate proximal PCA stenosis bilaterally.  Reviewed laboratory,Hemoglobin 11 point 9, BMP showed mild elevated glucose 117, low calcium 7.3  Echocardiogram showed ejection fraction 55-60% Ultrasound of carotid artery showed mild bilateral internal carotid artery stenosis  REVIEW OF SYSTEMS: Full 14 system review of systems performed and notable only for dizziness, seizure  ALLERGIES: Allergies  Allergen Reactions  . Ephedra [Ephedrine] Other (See Comments)    unknown  . Captopril Rash  . Phenobarbital Rash    HOME MEDICATIONS: Current Outpatient Prescriptions  Medication Sig Dispense Refill  . acetaminophen (TYLENOL) 500 MG tablet Take 2 tablets (1,000 mg total) by mouth every 8 (eight) hours. For one more week and then change to prn    . albuterol (PROVENTIL) (2.5 MG/3ML) 0.083% nebulizer solution Take 3 mLs (2.5 mg total) by nebulization every 2 (two) hours as needed for shortness of breath.    . anastrozole (ARIMIDEX) 1 MG tablet Take 1 tablet (1 mg total) by mouth daily. 90 tablet 5  . donepezil (ARICEPT) 10 MG tablet Take 1 tablet (10 mg total) by mouth at bedtime. 30 tablet 6  . fexofenadine (ALLEGRA ALLERGY) 60 MG tablet Take 1 tablet (60 mg total) by mouth 2 (two) times daily.    Marland Kitchen ipratropium-albuterol  (DUONEB) 0.5-2.5 (3) MG/3ML SOLN Take 3 mLs by nebulization 3 (three) times daily.    Marland Kitchen levETIRAcetam (KEPPRA) 500 MG tablet Take 1 tablet (500 mg total) by mouth 2 (two) times daily.    . metoprolol (LOPRESSOR) 50 MG tablet Take 50 mg by mouth 2 (two) times daily.     Marland Kitchen oxyCODONE (OXY IR/ROXICODONE) 5 MG immediate release tablet Take 1 tablet (5 mg total) by mouth every 6 (six) hours as needed for moderate pain. 30 tablet 0  . polyethylene glycol (MIRALAX / GLYCOLAX) packet Take 17 g by mouth daily.    . Protein (PROCEL) POWD Take 1 scoop by mouth 2 (two) times daily.    Marland Kitchen UNABLE TO FIND Med Name: Med pass twice daily     No current facility-administered medications for this visit.    PAST MEDICAL HISTORY: Past Medical History  Diagnosis Date  . Cancer (HCC)     breast  . Psoriasis   . Legally blind     macular degeneration  . Hypertension   . CAD (coronary artery disease) 2007    Last cath 2007, Last echo-09/01/10 EF 123456, RV Systolic 0000000 mmHg;Last Nuc 2006 no ischemia  . Pulmonary HTN ([place])  mild  . Seizure disorder ([place])        . H/O syncope     remotely, no etiology found, mild carotid disease on doppler, 0-49% bil  . Full dentures   . Wears glasses   . Lives in independent group home     independent living [place]-  . Memory deficit   . Seasonal allergies   . Hallucinations   . Cataracts, bilateral   . Macular degeneration   . CVA (cerebral infarction)   . Seizure ([place])     PAST SURGICAL HISTORY: Past Surgical History  Procedure Laterality Date  . Cholecystectomy    . Spine surgery    . Appendectomy    . Tonsillectomy    . Cardiac catheterization  11/22/2005    RCA with 20-30% eccentric narrowing other vessels free of disease  . Carpal tunnel release Right 07/29/2013    Procedure: CARPAL TUNNEL RELEASE;  Surgeon: [name], MD;  Location: [place];  Service: Orthopedics;  Laterality: Right;  . Gallbladder surgery    .  Intramedullary (im) nail intertrochanteric Left 09/14/2015    Procedure: INTRAMEDULLARY (IM) NAIL INTERTROCHANTRIC;  Surgeon: Rod Can, MD;  Location: [place];  Service: Orthopedics;  Laterality: Left;  . Hip surgery      FAMILY HISTORY: Family History  Problem Relation Age of Onset  . Pulmonary embolism Mother 32    caused her death  . CAD Father 77    caused his death  . Stroke Father   . Leukemia Brother 60    cause of death  . CAD Brother   . CAD Brother     SOCIAL HISTORY:  Social History   Social History  . Marital Status: Divorced    Spouse Name: N/A  . Number of Children: 1  . Years of Education: college   Occupational History  .      Retired - Therapist, sports   Social History Main Topics  . Smoking status: Former Smoker    Types: Cigarettes    Quit date: 11/20/1994  . Smokeless tobacco: Former Systems developer    Quit date: 11/20/1994  . Alcohol Use: No  . Drug Use: No  . Sexual Activity: No   Other Topics Concern  . Not on file   Social History Narrative   She is currently resides in [facility].   Retired Therapist, sports.   College education.   Right handed.   Caffeine- two cups daily coffee.     PHYSICAL EXAM   Filed Vitals:   10/04/15 1515  BP: 76/50  Pulse: 120    Not recorded      There is no weight on file to calculate BMI.  PHYSICAL EXAMNIATION:  Gen: NAD, conversant, well nourised, obese, well groomed                     Cardiovascular: Regular rate rhythm, no peripheral edema, warm, nontender. Eyes: Conjunctivae clear without exudates or hemorrhage Neck: Supple, no carotid bruise. Pulmonary: Clear to auscultation bilaterally   NEUROLOGICAL EXAM:  MENTAL STATUS: Speech:    Speech is normal; fluent and spontaneous with normal comprehension.  Cognition: Mini-Mental Status Examination is 11/27, animal naming is to     She is not oriented to time, place, season, [place]     Recent and remote memory: She missed 3 out of 3 recalls     Attention span and  concentration: She could not spell world backwards     Normal Language,  naming, repeating,spontaneous speech      CRANIAL NERVES:  Pale tired-looking elderly female CN II: Visual fields are full to confrontation. Fundoscopic exam is normal with sharp discs and no vascular changes. Pupils are round equal and briskly reactive to light. CN III, IV, VI: extraocular movement are normal. No ptosis. CN V: Facial sensation is intact to pinprick in all 3 divisions bilaterally. Corneal responses are intact.  CN VII: Face is symmetric with normal eye closure and smile. CN VIII: Hearing is normal to rubbing fingers CN IX, X: Palate elevates symmetrically. Phonation is normal. CN XI: Head turning and shoulder shrug are intact CN XII: Tongue is midline with normal movements and no atrophy.  MOTOR: There is no pronator drift of out-stretched arms. Muscle bulk and tone are normal. Muscle strength is normal.  REFLEXES: Reflexes are 2+ and symmetric at the biceps, triceps, knees, and ankles. Plantar responses are flexor.  SENSORY: Intact to light touch, pinprick, position sense, and vibration sense are intact in fingers and toes.  COORDINATION: Rapid alternating movements and fine finger movements are intact. There is no dysmetria on finger-to-nose and heel-knee-shin.    GAIT/STANCE: Needs 2 people assistant to get up from seated position, difficulty initiate gait, dragging her left leg,  DIAGNOSTIC DATA (LABS, IMAGING, TESTING) - I reviewed patient records, labs, notes, testing and imaging myself where available.   ASSESSMENT AND PLAN  Jaqua Kopplin is a 81 y.o. female   Dementia  Mini-Mental Status Examination is 25 out of 69  She has decreased appetite, will stop Aricept, Syncope  Low blood pressure,elevated heart rate  Stop metoprolol,  IV hydration 500 cc of normal saline every day for 2 days  Follow-up with facility primary care physician  Remote possibility of seizure, she is already  taking Keppra 500 mg twice a day, will decrease to 200 mg twice a day Gait difficulty:  Multifactorial, mainly due to left hip fracture, left hip pain   I also called her son, discussed above findings,  Marcial Pacas, M.D. Ph.D.  [facility] [address], [place], [place] [address] Ph: [phone] Fax: [phone]  CC: Referring Provider

## 2015-10-06 ENCOUNTER — Emergency Department (HOSPITAL_COMMUNITY)
Admission: EM | Admit: 2015-10-06 | Discharge: 2015-10-21 | Disposition: E | Payer: Medicare Other | Attending: Emergency Medicine | Admitting: Emergency Medicine

## 2015-10-06 DIAGNOSIS — Z853 Personal history of malignant neoplasm of breast: Secondary | ICD-10-CM | POA: Insufficient documentation

## 2015-10-06 DIAGNOSIS — I2510 Atherosclerotic heart disease of native coronary artery without angina pectoris: Secondary | ICD-10-CM | POA: Insufficient documentation

## 2015-10-06 DIAGNOSIS — Z8673 Personal history of transient ischemic attack (TIA), and cerebral infarction without residual deficits: Secondary | ICD-10-CM | POA: Diagnosis not present

## 2015-10-06 DIAGNOSIS — Z973 Presence of spectacles and contact lenses: Secondary | ICD-10-CM | POA: Diagnosis not present

## 2015-10-06 DIAGNOSIS — I10 Essential (primary) hypertension: Secondary | ICD-10-CM | POA: Diagnosis not present

## 2015-10-06 DIAGNOSIS — Z872 Personal history of diseases of the skin and subcutaneous tissue: Secondary | ICD-10-CM | POA: Diagnosis not present

## 2015-10-06 DIAGNOSIS — Z98891 History of uterine scar from previous surgery: Secondary | ICD-10-CM | POA: Insufficient documentation

## 2015-10-06 DIAGNOSIS — H269 Unspecified cataract: Secondary | ICD-10-CM | POA: Insufficient documentation

## 2015-10-06 DIAGNOSIS — Z972 Presence of dental prosthetic device (complete) (partial): Secondary | ICD-10-CM | POA: Diagnosis not present

## 2015-10-06 DIAGNOSIS — Z598 Other problems related to housing and economic circumstances: Secondary | ICD-10-CM | POA: Insufficient documentation

## 2015-10-06 DIAGNOSIS — Z9889 Other specified postprocedural states: Secondary | ICD-10-CM | POA: Insufficient documentation

## 2015-10-06 DIAGNOSIS — G40909 Epilepsy, unspecified, not intractable, without status epilepticus: Secondary | ICD-10-CM | POA: Insufficient documentation

## 2015-10-06 DIAGNOSIS — Z79899 Other long term (current) drug therapy: Secondary | ICD-10-CM | POA: Diagnosis not present

## 2015-10-06 DIAGNOSIS — H548 Legal blindness, as defined in USA: Secondary | ICD-10-CM | POA: Insufficient documentation

## 2015-10-06 DIAGNOSIS — I469 Cardiac arrest, cause unspecified: Secondary | ICD-10-CM | POA: Diagnosis not present

## 2015-10-06 MED ORDER — EPINEPHRINE HCL 0.1 MG/ML IJ SOSY
PREFILLED_SYRINGE | INTRAMUSCULAR | Status: AC | PRN
  Administered 2015-10-06 (×2): 1 via INTRAVENOUS

## 2015-10-06 MED ORDER — SODIUM BICARBONATE 8.4 % IV SOLN
INTRAVENOUS | Status: AC | PRN
  Administered 2015-10-06: 50 meq via INTRAVENOUS

## 2015-10-06 MED FILL — Medication: Qty: 1 | Status: AC

## 2015-10-21 NOTE — ED Notes (Signed)
Per [facility] patient from [facility] after being discharged from a hospital for a femur fracture 10/25.  Patient had been complaining of shortness of breath.  En route patient's cardiac rhythm became asystole and CPR was initiated.  Prior to arrival patient received 1000 mL NS, and 9 syringes of epi.  CPR ongoing after arrival to ED.

## 2015-10-21 NOTE — Progress Notes (Signed)
Pt brought in by EMS with CPR in progress. Pt being manually ventilated using [place] Airway with no complications. Pt continued to be manually ventilated throughout duration of code. Pt expired. [place] Airway remains in place, pending possible ME case. RT will continue to monitor for possible removal of airway if ME case denied.

## 2015-10-21 NOTE — Progress Notes (Signed)
While rounding in ED Pt. was brought to Ed as CPR in Progress. Pt. passed shortly after arrival.   I called patient's Son Marveen Reeks Tierrah Creger) in [place] and he advised me to call her caregiver Marijean Heath). Caregiver came to hospital and  spoke with Dr. Frederik Pear for update.  Dr. Frederik Pear spoke directly with patient's son.  Patient placement card was given to caregiver for son if there were further question.  Provided emotional support to caregiver and staff.  Chaplain is available as needed.

## 2015-10-21 NOTE — Code Documentation (Signed)
Patient time of death occurred at 22.

## 2015-10-21 NOTE — ED Provider Notes (Signed)
CSN: [id number]     Arrival date & time 10-20-2015  1329 History   First MD Initiated Contact with Patient Oct 20, 2015 1351     Chief Complaint  Patient presents with  . Cardiac Arrest   HPI Patient presented to the emergency room in cardiac arrest.  Patient had recent femur surgery for fracture on October 25. She has been recovering at a nursing care facility.  Patient started having difficulties with her breathing this morning. She was evaluated at the nursing home.   They called EMS because of her breathing issues. When EMS arrived shortly after beginning their assessment and treatment she became unresponsive. Patient lost pulses. EMS initiated CPR. They placed a [facility] airway. Patient initially appeared cyanotic and gray but had some good improvement of her skin color after CPR was initiated. However, despite treatment the patient continued in cardiac arrest. They continued CPR for over one hour. During this treatment, the patient had brief episodes of return of spontaneous circulation. She was given standard treatment including multiple doses of epinephrine. However she would quickly go back into cardiac arrest, PEA.  On arrival in the emergency room, patient remained in cardiac arrest. Past Medical History  Diagnosis Date  . Cancer (HCC)     breast  . Psoriasis   . Legally blind     macular degeneration  . Hypertension   . CAD (coronary artery disease) 2007    Last cath 2007, Last echo-09/01/10 EF 123456, RV Systolic 0000000 mmHg;Last Nuc 2006 no ischemia  . Pulmonary HTN (HCC)     mild  . Seizure disorder ([place])        . H/O syncope     remotely, no etiology found, mild carotid disease on doppler, 0-49% bil  . Full dentures   . Wears glasses   . Lives in independent group home     independent living [place]-  . Memory deficit   . Seasonal allergies   . Hallucinations   . Cataracts, bilateral   . Macular degeneration   . CVA (cerebral infarction)   . Seizure [facility])    Past  Surgical History  Procedure Laterality Date  . Cholecystectomy    . Spine surgery    . Appendectomy    . Tonsillectomy    . Cardiac catheterization  11/22/2005    RCA with 20-30% eccentric narrowing other vessels free of disease  . Carpal tunnel release Right 07/29/2013    Procedure: CARPAL TUNNEL RELEASE;  Surgeon: [name], MD;  Location: [place];  Service: Orthopedics;  Laterality: Right;  . Gallbladder surgery    . Intramedullary (im) nail intertrochanteric Left 09/14/2015    Procedure: INTRAMEDULLARY (IM) NAIL INTERTROCHANTRIC;  Surgeon: Rod Can, MD;  Location: [place];  Service: Orthopedics;  Laterality: Left;  . Hip surgery     Family History  Problem Relation Age of Onset  . Pulmonary embolism Mother 25    caused her death  . CAD Father 63    caused his death  . Stroke Father   . Leukemia Brother 60    cause of death  . CAD Brother   . CAD Brother    Social History  Substance Use Topics  . Smoking status: Former Smoker    Types: Cigarettes    Quit date: 11/20/1994  . Smokeless tobacco: Former Systems developer    Quit date: 11/20/1994  . Alcohol Use: No   OB History    No data available     Review of  Systems  Unable to perform ROS: Patient nonverbal      Allergies  Ephedra; Captopril; and Phenobarbital  Home Medications   Prior to Admission medications   Medication Sig Start Date End Date Taking? Authorizing Provider  acetaminophen (TYLENOL) 500 MG tablet Take 2 tablets (1,000 mg total) by mouth every 8 (eight) hours. For one more week and then change to prn 09/17/15   Jonetta Osgood, MD  albuterol (PROVENTIL) (2.5 MG/3ML) 0.083% nebulizer solution Take 3 mLs (2.5 mg total) by nebulization every 2 (two) hours as needed for shortness of breath. 09/17/15   Shanker Kristeen Mans, MD  anastrozole (ARIMIDEX) 1 MG tablet Take 1 tablet (1 mg total) by mouth daily. 07/15/15   Chauncey Cruel, MD  ipratropium-albuterol (DUONEB) 0.5-2.5 (3) MG/3ML SOLN  Take 3 mLs by nebulization 3 (three) times daily. 09/17/15   Shanker Kristeen Mans, MD  levETIRAcetam (KEPPRA) 250 MG tablet Take 1 tablet (250 mg total) by mouth 2 (two) times daily. 10/04/15   Marcial Pacas, MD  metoprolol (LOPRESSOR) 50 MG tablet Take 50 mg by mouth 2 (two) times daily.  04/23/12   Historical Provider, MD  oxyCODONE (OXY IR/ROXICODONE) 5 MG immediate release tablet Take 1 tablet (5 mg total) by mouth every 6 (six) hours as needed for moderate pain. 09/17/15   Shanker Kristeen Mans, MD  polyethylene glycol (MIRALAX / GLYCOLAX) packet Take 17 g by mouth daily. 09/17/15   Shanker Kristeen Mans, MD  Protein (PROCEL) POWD Take 1 scoop by mouth 2 (two) times daily.    Historical Provider, MD  UNABLE TO FIND Med Name: Med pass twice daily    Historical Provider, MD   BP 0/0 mmHg  Resp 0  Wt 114 lb (51.71 kg)  SpO2 0% Physical Exam  Constitutional:  Frail, elderly, unresponsive  HENT:  King airway in the oropharynx  Eyes:  Pupils are nonreactive  Neck: No tracheal deviation present. No thyromegaly present.  Cardiovascular:  Absent heart sounds, palpable pulses with CPR  Pulmonary/Chest:  No spontaneous respirations, equal breath sounds with bag ventilation  Abdominal: Soft. She exhibits no distension. There is no tenderness. There is no rebound and no guarding.  Musculoskeletal: She exhibits no edema.  Neurological: GCS eye subscore is 1. GCS verbal subscore is 1. GCS motor subscore is 1.  Skin: No rash noted. She is not diaphoretic. No erythema.    ED Course  .Critical Care Performed by: Dorie Rank Authorized by: Dorie Rank Total critical care time: 20 minutes Critical care time was exclusive of separately billable procedures and treating other patients and teaching time. Critical care was necessary to treat or prevent imminent or life-threatening deterioration of the following conditions: cardiac failure and circulatory failure. Critical care was time spent personally by me on the  following activities: examination of patient, ordering and performing treatments and interventions and discussions with consultants.       MDM   Final diagnoses:  Cardiac arrest [facility])    CPR was continued in the emergency department. Epinephrine and sodium bicarbonate was given. We had an episode of brief return of spontaneous circulation.  However, shortly after discontinuing CPR the patient became more bradycardic and eventually we lost pulses. Patient had received over an hour of CPR.  Her chance of recovery with neurologic function is extremely low.   Pt was declared dead and cpr discontinued.  Notified pt's son and Dr Chapman Fitch of Mrs.  Batra's unfortunate death.    Dorie Rank, MD Oct 31, 2015 [phone]

## 2015-10-21 DEATH — deceased

## 2015-11-18 ENCOUNTER — Ambulatory Visit: Payer: Medicare Other | Admitting: Neurology

## 2016-04-29 ENCOUNTER — Other Ambulatory Visit: Payer: Self-pay | Admitting: Nurse Practitioner

## 2016-07-14 ENCOUNTER — Ambulatory Visit: Payer: Medicare Other | Admitting: Nurse Practitioner

## 2016-07-14 ENCOUNTER — Other Ambulatory Visit: Payer: Medicare Other

## 2016-07-24 NOTE — Progress Notes (Signed)
This encounter was created in error - please disregard.

## 2016-08-07 ENCOUNTER — Ambulatory Visit: Payer: Medicare Other | Admitting: Nurse Practitioner

## 2016-08-07 ENCOUNTER — Other Ambulatory Visit: Payer: Medicare Other

## 2017-05-31 IMAGING — CR DG PORTABLE PELVIS
1 series · 1 of 1 positions shown · non-contrast
Comparison: Operative images from this same date

CLINICAL DATA: Status post left femur fracture ORIF

EXAM:
PORTABLE PELVIS 1-2 VIEWS

[AP]
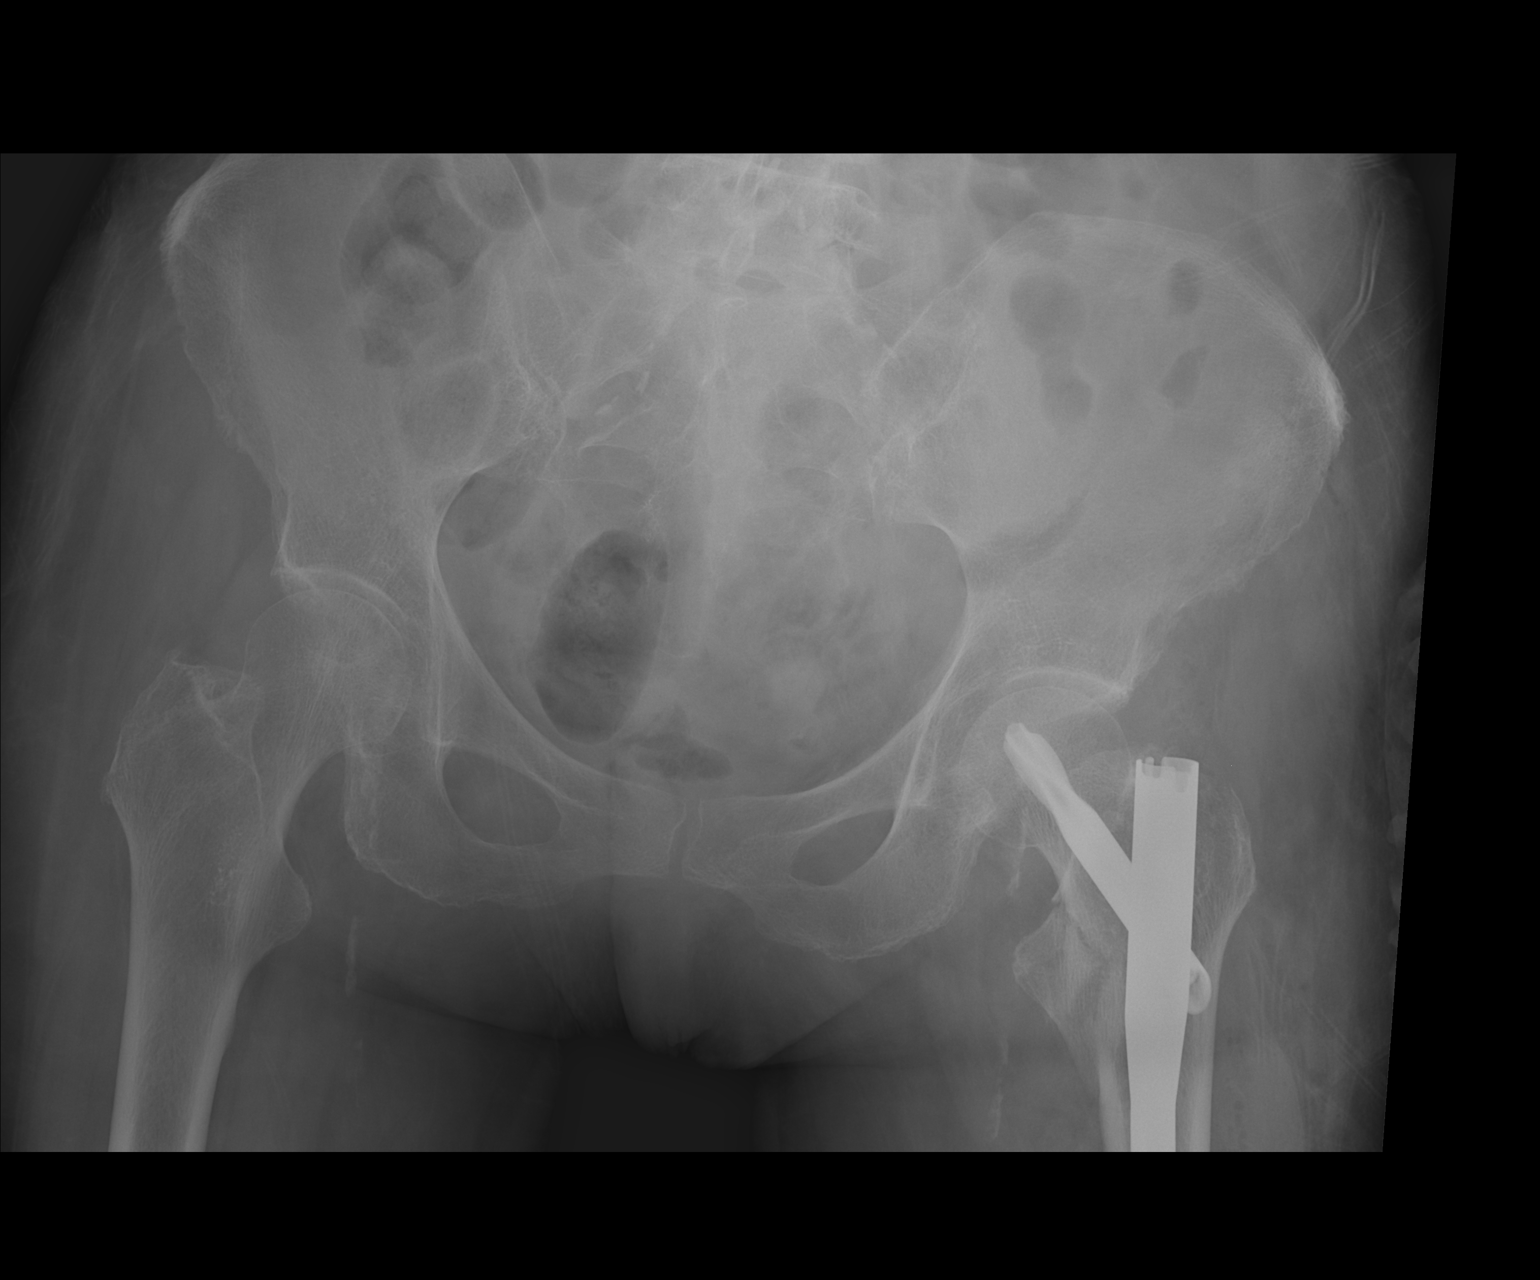

[1 of 1 positions shown; findings below may reference images not displayed]

FINDINGS: Intra medullary rod supports a compression screw which extends from
the lateral proximal femur across the neck into the femoral head.
Primary fracture components are in anatomic alignment on this single
view. There is a nondisplaced fracture across the base of the lesser
trochanter. There is no evidence of a new fracture or operative
complication.
IMPRESSION: Well aligned fracture following ORIF.

## 2017-05-31 IMAGING — RF DG C-ARM 61-120 MIN
1 series · 4 of 4 positions shown · non-contrast
Comparison: Left hip radiographs 09/14/2015.

CLINICAL DATA: ORIF left hip fracture.

EXAM:
DG C-ARM 61-120 MIN; LEFT FEMUR 2 VIEWS

[Series 1: run · 4 of 4 slices shown]
[im 1/4]
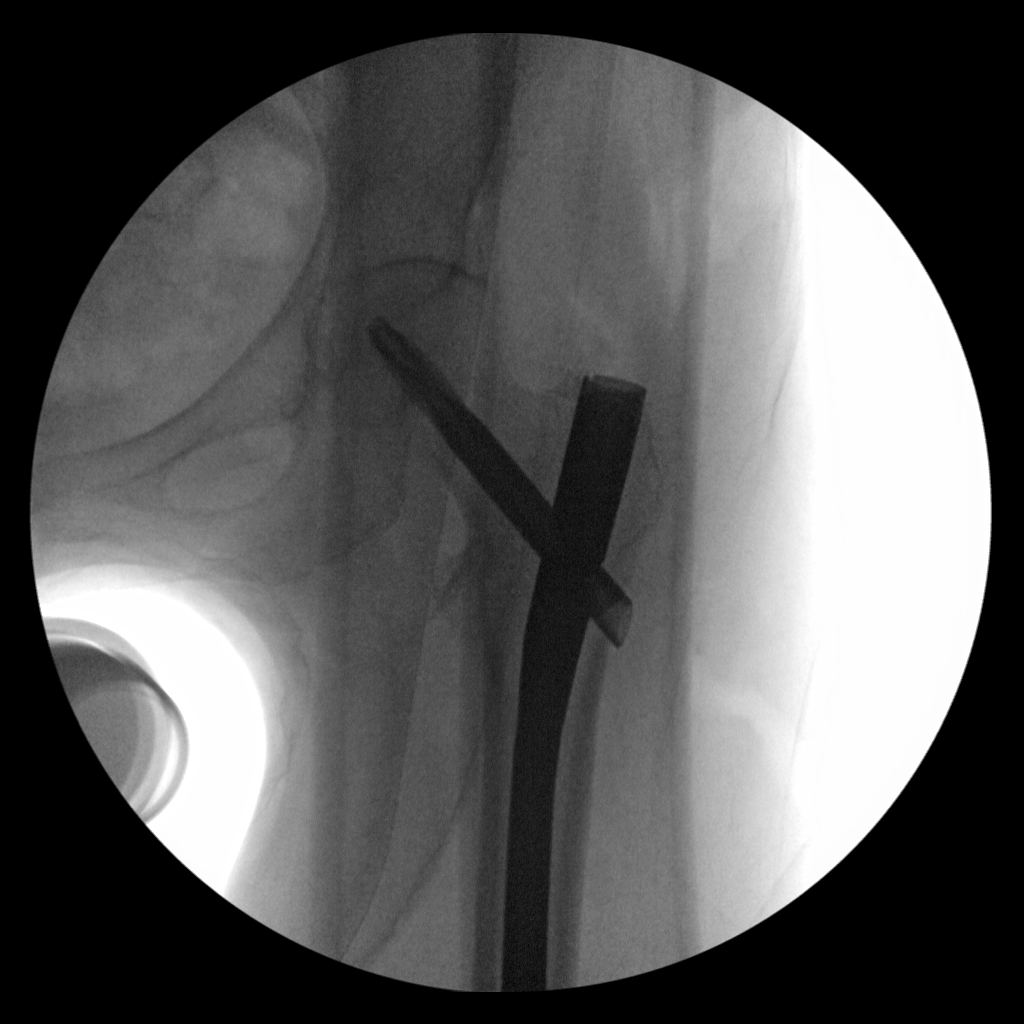
[im 2/4]
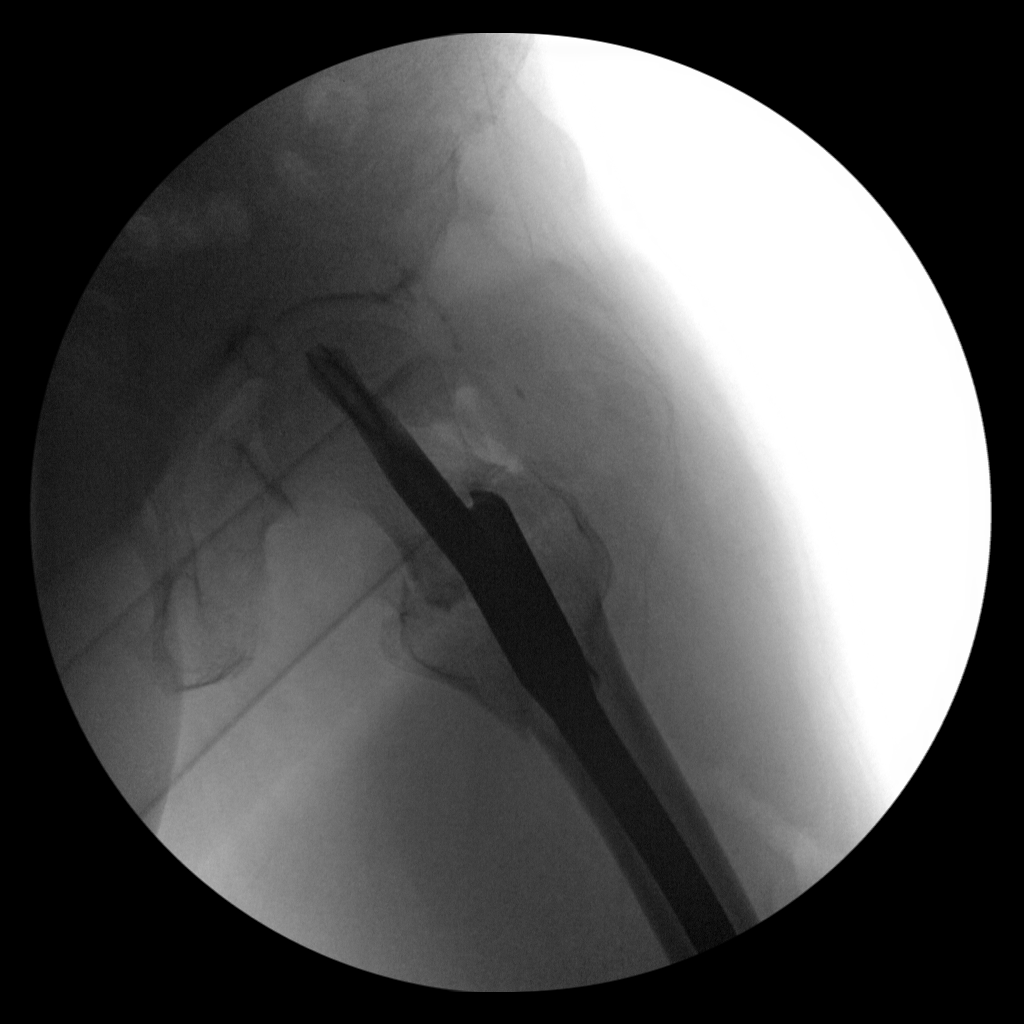
[im 3/4]
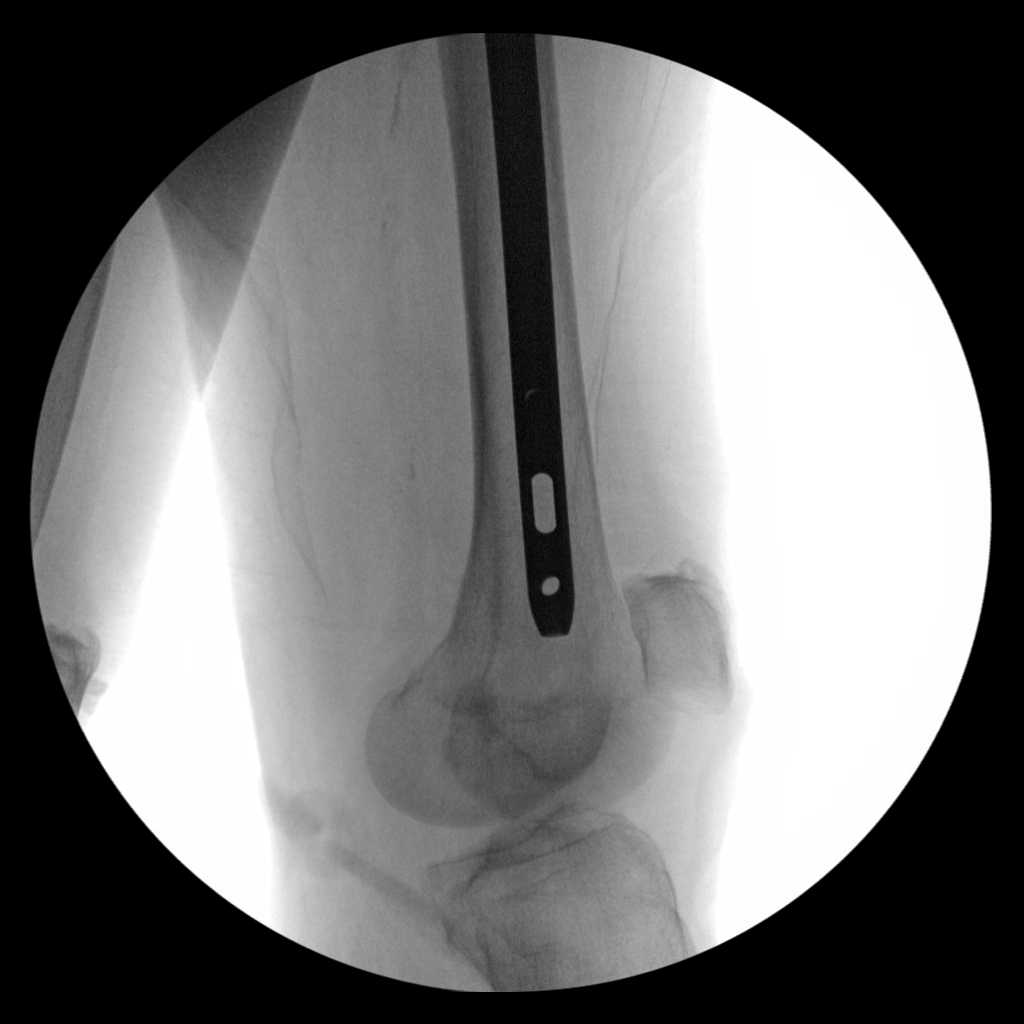
[im 4/4]
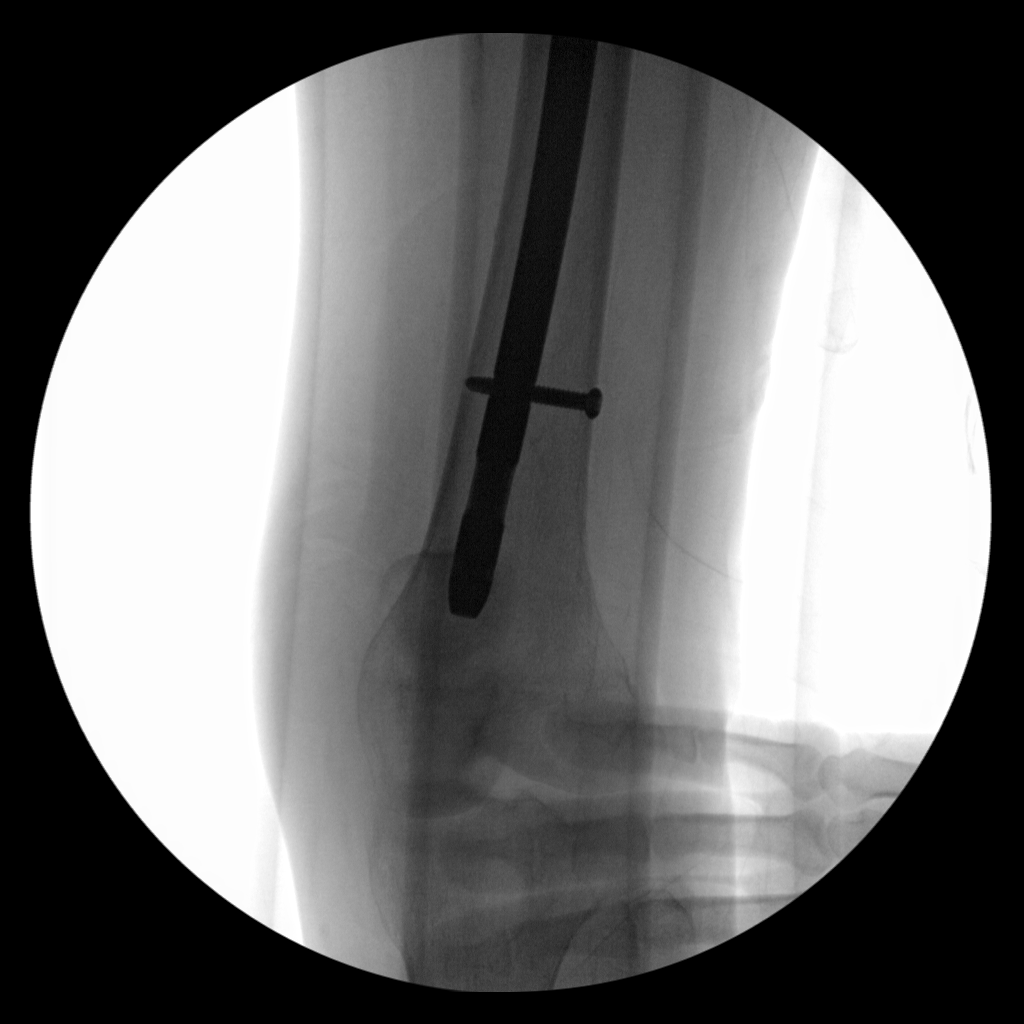

[4 of 4 positions shown; findings below may reference images not displayed]

FINDINGS: ORIF is noted. An intra medullary rod is in place. A single distal
interlocking screw is noted. A dynamic hip screw is present across
the intertrochanteric fracture. Fracture is reduced.
IMPRESSION: ORIF of the left femur without radiographic evidence for
complication.

## 2017-05-31 IMAGING — DX DG HIP (WITH OR WITHOUT PELVIS) 2-3V*L*
3 series · 3 of 3 positions shown · non-contrast
Comparison: None.

CLINICAL DATA: Fall with left hip fracture. Initial encounter.

EXAM:
DG HIP (WITH OR WITHOUT PELVIS) 2-3V LEFT

[t pelvis ap]
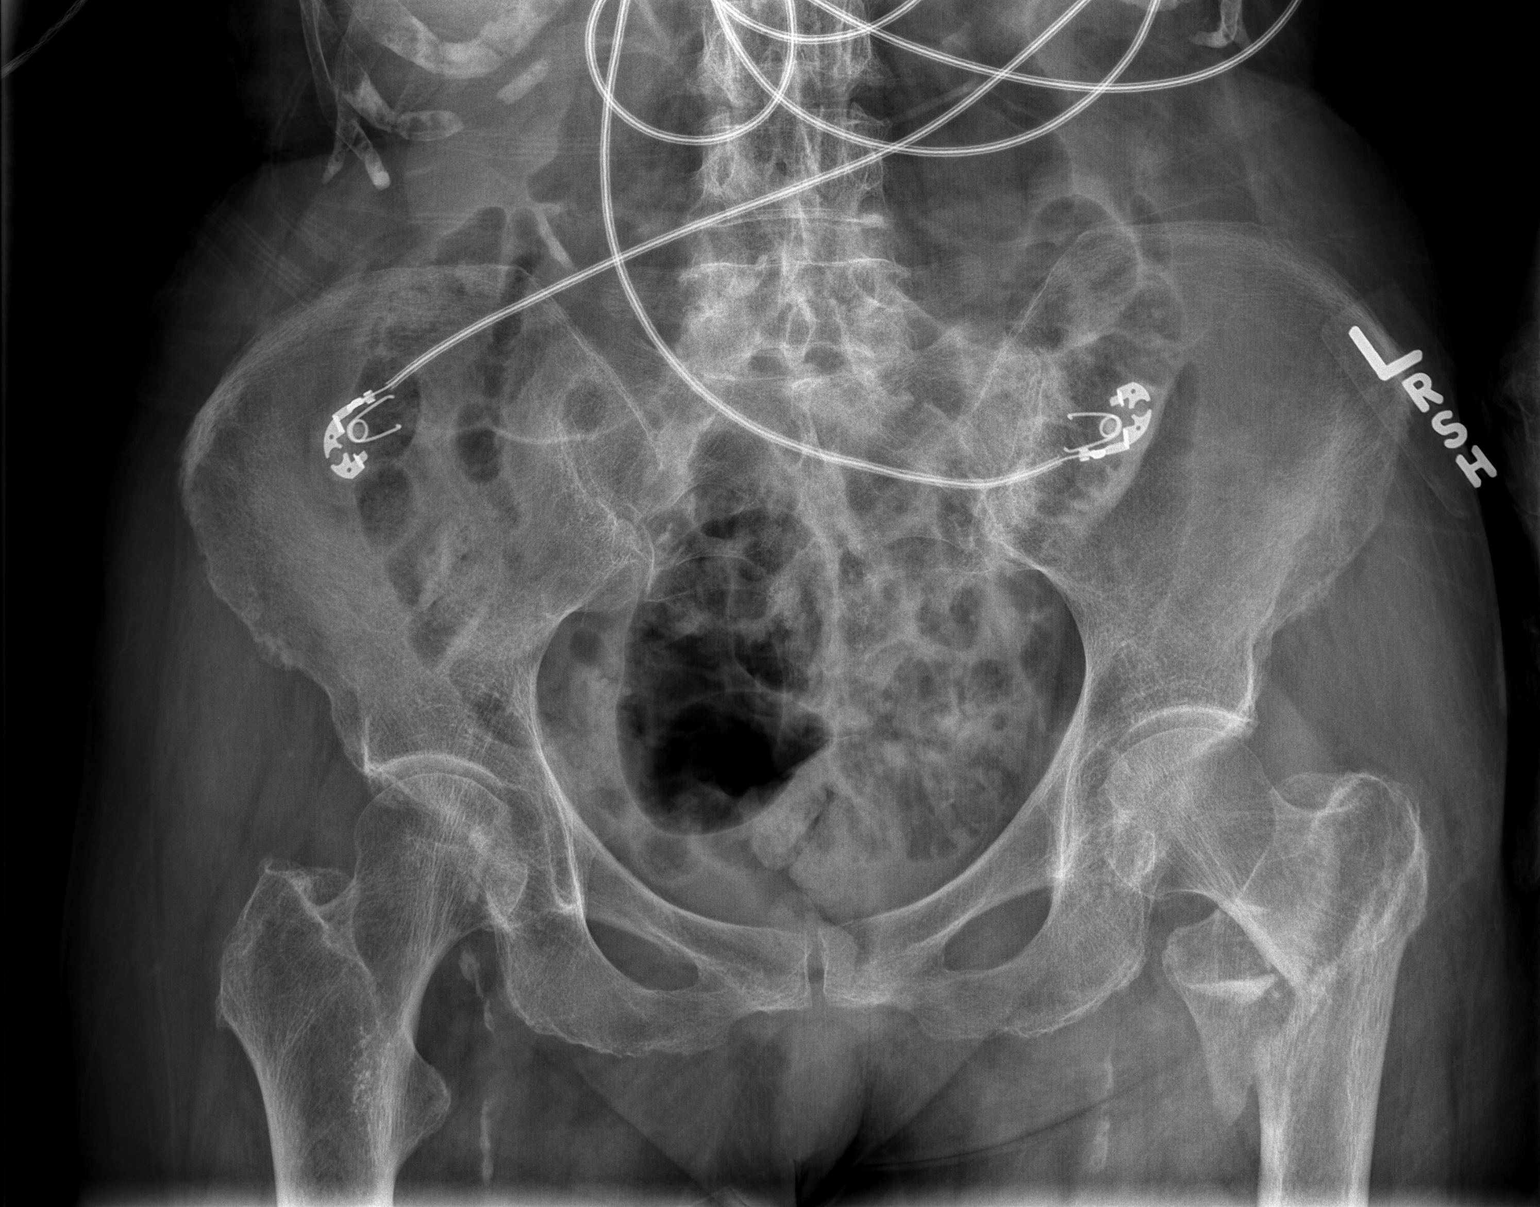

[t hip ap left]
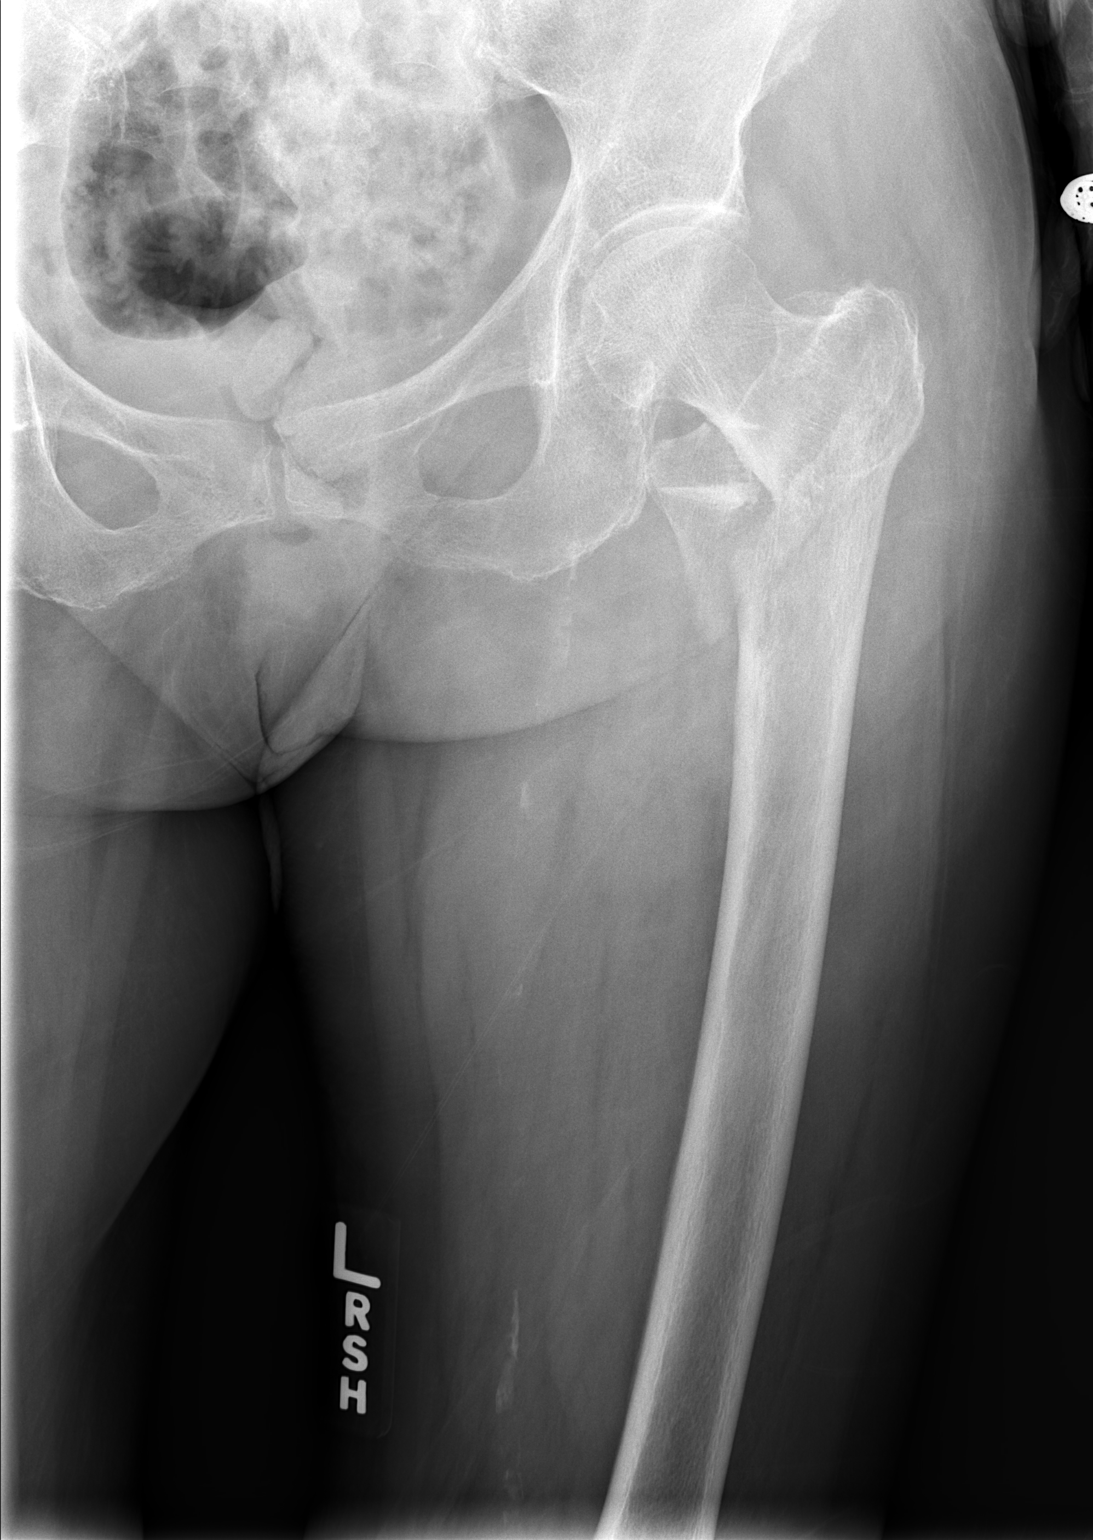

[x hip lat left]
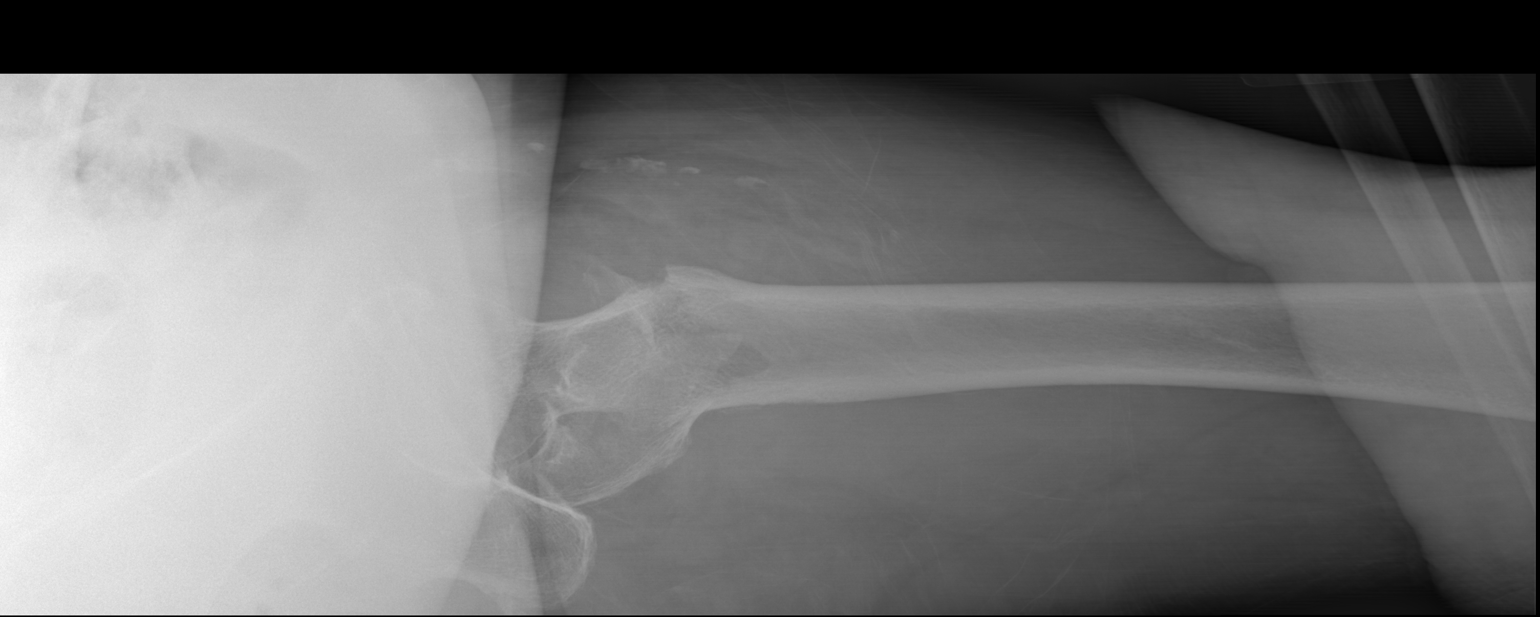

[3 of 3 positions shown; findings below may reference images not displayed]

FINDINGS: Acute intertrochanteric left femur fracture with apex ventral
angulation and medial impaction. Both hips are located. No evidence
of pelvic ring fracture or diastasis.

Osteopenia and atherosclerosis.
IMPRESSION: Displaced intertrochanteric left femur fracture.
